# Patient Record
Sex: Female | Born: 1935 | Race: White | Hispanic: No | Smoking: Never smoker
Health system: Southern US, Community
[De-identification: ages and names within clinical notes are randomized; demographics above are authoritative.]

## PROBLEM LIST (undated history)

## (undated) DIAGNOSIS — M1990 Unspecified osteoarthritis, unspecified site: Secondary | ICD-10-CM

## (undated) DIAGNOSIS — F0390 Unspecified dementia without behavioral disturbance: Secondary | ICD-10-CM

## (undated) DIAGNOSIS — H5440 Blindness, one eye, unspecified eye: Secondary | ICD-10-CM

## (undated) HISTORY — PX: CHOLECYSTECTOMY: SHX55

---

## 2004-08-15 ENCOUNTER — Encounter: Payer: Self-pay | Admitting: Family Medicine

## 2004-09-01 ENCOUNTER — Encounter: Payer: Self-pay | Admitting: Family Medicine

## 2004-10-02 ENCOUNTER — Encounter: Payer: Self-pay | Admitting: Family Medicine

## 2011-08-13 ENCOUNTER — Ambulatory Visit: Payer: Self-pay | Admitting: Family Medicine

## 2011-08-19 ENCOUNTER — Emergency Department: Payer: Self-pay | Admitting: *Deleted

## 2011-08-19 LAB — COMPREHENSIVE METABOLIC PANEL
BUN: 20 mg/dL — ABNORMAL HIGH (ref 7–18)
Calcium, Total: 8.9 mg/dL (ref 8.5–10.1)
Chloride: 107 mmol/L (ref 98–107)
Co2: 31 mmol/L (ref 21–32)
Creatinine: 0.79 mg/dL (ref 0.60–1.30)
EGFR (African American): >60
EGFR (Non-African Amer.): >60
Glucose: 87 mg/dL (ref 65–99)
Osmolality: 291 (ref 275–301)
SGOT(AST): 27 U/L (ref 15–37)
Sodium: 145 mmol/L (ref 136–145)
Total Protein: 6.4 g/dL (ref 6.4–8.2)

## 2011-08-19 LAB — URINALYSIS, COMPLETE
Glucose,UR: NEGATIVE mg/dL (ref 0–75)
Hyaline Cast: 6
Ph: 6 (ref 4.5–8.0)
RBC,UR: 2 /HPF (ref 0–5)
Specific Gravity: 1.018 (ref 1.003–1.030)

## 2011-08-19 LAB — CBC
HGB: 13.3 g/dL (ref 12.0–16.0)
MCH: 32.2 pg (ref 26.0–34.0)
MCHC: 33.5 g/dL (ref 32.0–36.0)
MCV: 96 fL (ref 80–100)
Platelet: 146 10*3/uL — ABNORMAL LOW (ref 150–440)
RBC: 4.12 10*6/uL (ref 3.80–5.20)

## 2011-08-19 LAB — TROPONIN I: Troponin-I: <0.02 ng/mL

## 2011-08-19 LAB — LIPASE, BLOOD: Lipase: 213 U/L (ref 73–393)

## 2011-12-09 ENCOUNTER — Inpatient Hospital Stay: Payer: Self-pay | Admitting: Internal Medicine

## 2011-12-09 LAB — COMPREHENSIVE METABOLIC PANEL
Albumin: 2.7 g/dL — ABNORMAL LOW (ref 3.4–5.0)
Alkaline Phosphatase: 190 U/L — ABNORMAL HIGH (ref 50–136)
Anion Gap: 11 (ref 7–16)
BUN: 47 mg/dL — ABNORMAL HIGH (ref 7–18)
Calcium, Total: 8.6 mg/dL (ref 8.5–10.1)
Co2: 22 mmol/L (ref 21–32)
EGFR (Non-African Amer.): 16 — ABNORMAL LOW
Osmolality: 301 (ref 275–301)
SGOT(AST): 117 U/L — ABNORMAL HIGH (ref 15–37)
Sodium: 144 mmol/L (ref 136–145)
Total Protein: 5.5 g/dL — ABNORMAL LOW (ref 6.4–8.2)

## 2011-12-09 LAB — URINALYSIS, COMPLETE
Bilirubin,UR: NEGATIVE
Glucose,UR: NEGATIVE mg/dL (ref 0–75)
Ketone: NEGATIVE
Ph: 7 (ref 4.5–8.0)
RBC,UR: 38 /HPF (ref 0–5)
Squamous Epithelial: 1
WBC UR: 56 /HPF (ref 0–5)

## 2011-12-09 LAB — CBC
MCH: 31.9 pg (ref 26.0–34.0)
Platelet: 81 10*3/uL — ABNORMAL LOW (ref 150–440)
RBC: 3.74 10*6/uL — ABNORMAL LOW (ref 3.80–5.20)
RDW: 13.8 % (ref 11.5–14.5)
WBC: 8.1 10*3/uL (ref 3.6–11.0)

## 2011-12-09 LAB — TSH: Thyroid Stimulating Horm: 1.45 u[IU]/mL

## 2011-12-09 LAB — CK TOTAL AND CKMB (NOT AT ARMC)
CK, Total: 173 U/L (ref 21–215)
CK-MB: 1 ng/mL (ref 0.5–3.6)

## 2011-12-09 LAB — TROPONIN I: Troponin-I: 0.35 ng/mL — ABNORMAL HIGH

## 2011-12-10 DIAGNOSIS — I219 Acute myocardial infarction, unspecified: Secondary | ICD-10-CM

## 2011-12-10 LAB — CBC WITH DIFFERENTIAL/PLATELET
Basophil #: 0 10*3/uL (ref 0.0–0.1)
Eosinophil #: 0 10*3/uL (ref 0.0–0.7)
Eosinophil %: 0.6 %
Lymphocyte #: 0.9 10*3/uL — ABNORMAL LOW (ref 1.0–3.6)
Lymphocyte %: 14 %
MCV: 95 fL (ref 80–100)
Monocyte %: 3.9 %
Neutrophil %: 81.1 %
Platelet: 63 10*3/uL — ABNORMAL LOW (ref 150–440)
RDW: 14.3 % (ref 11.5–14.5)
WBC: 6.5 10*3/uL (ref 3.6–11.0)

## 2011-12-10 LAB — BASIC METABOLIC PANEL
Anion Gap: 10 (ref 7–16)
BUN: 49 mg/dL — ABNORMAL HIGH (ref 7–18)
Chloride: 116 mmol/L — ABNORMAL HIGH (ref 98–107)
Co2: 22 mmol/L (ref 21–32)
Creatinine: 2 mg/dL — ABNORMAL HIGH (ref 0.60–1.30)
EGFR (African American): 25 — ABNORMAL LOW
Potassium: 3.3 mmol/L — ABNORMAL LOW (ref 3.5–5.1)
Sodium: 148 mmol/L — ABNORMAL HIGH (ref 136–145)

## 2011-12-10 LAB — TROPONIN I: Troponin-I: 0.25 ng/mL — ABNORMAL HIGH

## 2011-12-11 LAB — HEPATIC FUNCTION PANEL A (ARMC)
Albumin: 2.1 g/dL — ABNORMAL LOW (ref 3.4–5.0)
Alkaline Phosphatase: 159 U/L — ABNORMAL HIGH (ref 50–136)
Bilirubin, Direct: 0.3 mg/dL — ABNORMAL HIGH (ref 0.00–0.20)
Bilirubin,Total: 0.7 mg/dL (ref 0.2–1.0)
Total Protein: 4.6 g/dL — ABNORMAL LOW (ref 6.4–8.2)

## 2011-12-11 LAB — CBC WITH DIFFERENTIAL/PLATELET
Basophil #: 0 10*3/uL (ref 0.0–0.1)
Eosinophil #: 0.2 10*3/uL (ref 0.0–0.7)
Lymphocyte %: 11.9 %
MCH: 32.1 pg (ref 26.0–34.0)
MCHC: 34.2 g/dL (ref 32.0–36.0)
Monocyte #: 0.4 x10 3/mm (ref 0.2–0.9)
Neutrophil %: 76.9 %
Platelet: 56 10*3/uL — ABNORMAL LOW (ref 150–440)
RBC: 3.33 10*6/uL — ABNORMAL LOW (ref 3.80–5.20)
RDW: 14.2 % (ref 11.5–14.5)
WBC: 5.1 10*3/uL (ref 3.6–11.0)

## 2011-12-11 LAB — BASIC METABOLIC PANEL
Calcium, Total: 7.9 mg/dL — ABNORMAL LOW (ref 8.5–10.1)
Co2: 19 mmol/L — ABNORMAL LOW (ref 21–32)
Creatinine: 1.52 mg/dL — ABNORMAL HIGH (ref 0.60–1.30)
Glucose: 110 mg/dL — ABNORMAL HIGH (ref 65–99)
Osmolality: 299 (ref 275–301)
Sodium: 144 mmol/L (ref 136–145)

## 2011-12-12 LAB — CBC WITH DIFFERENTIAL/PLATELET
Basophil #: 0 10*3/uL (ref 0.0–0.1)
Basophil %: 0.4 %
Eosinophil #: 0.3 10*3/uL (ref 0.0–0.7)
HCT: 32.6 % — ABNORMAL LOW (ref 35.0–47.0)
Monocyte #: 0.7 x10 3/mm (ref 0.2–0.9)
Neutrophil #: 4.2 10*3/uL (ref 1.4–6.5)
Neutrophil %: 65.8 %
Platelet: 52 10*3/uL — ABNORMAL LOW (ref 150–440)
RBC: 3.48 10*6/uL — ABNORMAL LOW (ref 3.80–5.20)
RDW: 14.9 % — ABNORMAL HIGH (ref 11.5–14.5)
WBC: 6.4 10*3/uL (ref 3.6–11.0)

## 2011-12-12 LAB — COMPREHENSIVE METABOLIC PANEL
Albumin: 2 g/dL — ABNORMAL LOW (ref 3.4–5.0)
Alkaline Phosphatase: 181 U/L — ABNORMAL HIGH (ref 50–136)
Anion Gap: 6 — ABNORMAL LOW (ref 7–16)
Bilirubin,Total: 0.5 mg/dL (ref 0.2–1.0)
Calcium, Total: 8.1 mg/dL — ABNORMAL LOW (ref 8.5–10.1)
Chloride: 115 mmol/L — ABNORMAL HIGH (ref 98–107)
Co2: 22 mmol/L (ref 21–32)
EGFR (African American): 45 — ABNORMAL LOW
EGFR (Non-African Amer.): 39 — ABNORMAL LOW
Glucose: 121 mg/dL — ABNORMAL HIGH (ref 65–99)
Osmolality: 293 (ref 275–301)
Sodium: 143 mmol/L (ref 136–145)

## 2011-12-13 LAB — URINE CULTURE

## 2011-12-19 ENCOUNTER — Ambulatory Visit: Payer: Self-pay | Admitting: Urology

## 2012-01-01 ENCOUNTER — Ambulatory Visit: Payer: Self-pay | Admitting: Urology

## 2012-07-20 ENCOUNTER — Ambulatory Visit: Payer: Self-pay | Admitting: Family Medicine

## 2012-11-27 ENCOUNTER — Ambulatory Visit: Payer: Self-pay

## 2012-12-08 ENCOUNTER — Ambulatory Visit: Payer: Self-pay | Admitting: Urology

## 2013-01-21 ENCOUNTER — Inpatient Hospital Stay: Payer: Self-pay | Admitting: Internal Medicine

## 2013-01-21 LAB — CBC WITH DIFFERENTIAL/PLATELET
Basophil #: 0 10*3/uL (ref 0.0–0.1)
Basophil %: 0.3 %
Eosinophil #: 0 10*3/uL (ref 0.0–0.7)
HGB: 12 g/dL (ref 12.0–16.0)
Lymphocyte %: 5.1 %
MCH: 31.2 pg (ref 26.0–34.0)
MCHC: 34.2 g/dL (ref 32.0–36.0)
MCV: 91 fL (ref 80–100)
Neutrophil #: 6.2 10*3/uL (ref 1.4–6.5)
Neutrophil %: 93 %
Platelet: 137 10*3/uL — ABNORMAL LOW (ref 150–440)
WBC: 6.7 10*3/uL (ref 3.6–11.0)

## 2013-01-21 LAB — COMPREHENSIVE METABOLIC PANEL
BUN: 16 mg/dL (ref 7–18)
Calcium, Total: 8.3 mg/dL — ABNORMAL LOW (ref 8.5–10.1)
Co2: 23 mmol/L (ref 21–32)
Creatinine: 1.52 mg/dL — ABNORMAL HIGH (ref 0.60–1.30)
EGFR (African American): 35 — ABNORMAL LOW
EGFR (Non-African Amer.): 30 — ABNORMAL LOW
Osmolality: 280 (ref 275–301)
Potassium: 3.7 mmol/L (ref 3.5–5.1)
SGPT (ALT): 12 U/L (ref 12–78)
Total Protein: 6.2 g/dL — ABNORMAL LOW (ref 6.4–8.2)

## 2013-01-21 LAB — URINALYSIS, COMPLETE
Glucose,UR: NEGATIVE mg/dL (ref 0–75)
Ketone: NEGATIVE
Protein: 100
RBC,UR: 25 /HPF (ref 0–5)
Specific Gravity: 1.011 (ref 1.003–1.030)
Squamous Epithelial: NONE SEEN
WBC UR: 92 /HPF (ref 0–5)

## 2013-01-22 LAB — BASIC METABOLIC PANEL WITH GFR
Anion Gap: 3 — ABNORMAL LOW
BUN: 20 mg/dL — ABNORMAL HIGH
Calcium, Total: 7.9 mg/dL — ABNORMAL LOW
Chloride: 113 mmol/L — ABNORMAL HIGH
Co2: 24 mmol/L
Creatinine: 1.53 mg/dL — ABNORMAL HIGH
EGFR (African American): 35 — ABNORMAL LOW
EGFR (Non-African Amer.): 30 — ABNORMAL LOW
Glucose: 71 mg/dL
Osmolality: 280
Potassium: 3.9 mmol/L
Sodium: 140 mmol/L

## 2013-01-24 LAB — URINE CULTURE

## 2013-01-26 LAB — CULTURE, BLOOD (SINGLE)

## 2013-01-27 LAB — CULTURE, BLOOD (SINGLE)

## 2013-01-29 ENCOUNTER — Inpatient Hospital Stay: Payer: Self-pay | Admitting: Internal Medicine

## 2013-01-29 LAB — URINALYSIS, COMPLETE
Glucose,UR: NEGATIVE mg/dL (ref 0–75)
Ketone: NEGATIVE
Ph: 5 (ref 4.5–8.0)
Protein: NEGATIVE
Specific Gravity: 1.005 (ref 1.003–1.030)

## 2013-01-29 LAB — COMPREHENSIVE METABOLIC PANEL
Albumin: 2 g/dL — ABNORMAL LOW (ref 3.4–5.0)
Alkaline Phosphatase: 233 U/L — ABNORMAL HIGH
BUN: 25 mg/dL — ABNORMAL HIGH (ref 7–18)
Bilirubin,Total: 0.6 mg/dL (ref 0.2–1.0)
Co2: 23 mmol/L (ref 21–32)
EGFR (African American): 24 — ABNORMAL LOW
Osmolality: 267 (ref 275–301)
Potassium: 4.2 mmol/L (ref 3.5–5.1)
SGPT (ALT): 20 U/L (ref 12–78)
Sodium: 132 mmol/L — ABNORMAL LOW (ref 136–145)

## 2013-01-29 LAB — CBC
HCT: 30.1 % — ABNORMAL LOW (ref 35.0–47.0)
HGB: 10.2 g/dL — ABNORMAL LOW (ref 12.0–16.0)
Platelet: 164 10*3/uL (ref 150–440)
WBC: 15.1 10*3/uL — ABNORMAL HIGH (ref 3.6–11.0)

## 2013-01-30 LAB — CBC WITH DIFFERENTIAL/PLATELET
Basophil #: 0.1 10*3/uL (ref 0.0–0.1)
Eosinophil %: 2.9 %
HGB: 9.8 g/dL — ABNORMAL LOW (ref 12.0–16.0)
Lymphocyte %: 8.6 %
MCHC: 33.9 g/dL (ref 32.0–36.0)
Monocyte %: 3.8 %
Platelet: 172 10*3/uL (ref 150–440)
RBC: 3.18 10*6/uL — ABNORMAL LOW (ref 3.80–5.20)
WBC: 12.1 10*3/uL — ABNORMAL HIGH (ref 3.6–11.0)

## 2013-01-30 LAB — BASIC METABOLIC PANEL
Anion Gap: 7 (ref 7–16)
Calcium, Total: 7.7 mg/dL — ABNORMAL LOW (ref 8.5–10.1)
Chloride: 106 mmol/L (ref 98–107)
Co2: 22 mmol/L (ref 21–32)
Creatinine: 1.74 mg/dL — ABNORMAL HIGH (ref 0.60–1.30)
EGFR (Non-African Amer.): 26 — ABNORMAL LOW
Osmolality: 273 (ref 275–301)
Potassium: 3.9 mmol/L (ref 3.5–5.1)
Sodium: 135 mmol/L — ABNORMAL LOW (ref 136–145)

## 2013-01-31 LAB — CREATININE, SERUM
Creatinine: 1.33 mg/dL — ABNORMAL HIGH (ref 0.60–1.30)
EGFR (African American): 41 — ABNORMAL LOW

## 2013-02-01 LAB — URINE CULTURE

## 2013-02-03 LAB — CULTURE, BLOOD (SINGLE)

## 2013-05-16 ENCOUNTER — Ambulatory Visit: Payer: Self-pay | Admitting: Orthopedic Surgery

## 2013-05-16 ENCOUNTER — Inpatient Hospital Stay: Payer: Self-pay | Admitting: Family Medicine

## 2013-05-16 LAB — URINALYSIS, COMPLETE
BILIRUBIN, UR: NEGATIVE
Glucose,UR: NEGATIVE mg/dL (ref 0–75)
KETONE: NEGATIVE
Nitrite: NEGATIVE
Ph: 7 (ref 4.5–8.0)
Specific Gravity: 1.009 (ref 1.003–1.030)
Squamous Epithelial: 1
WBC UR: 109 /HPF (ref 0–5)

## 2013-05-16 LAB — CBC
HCT: 34.3 % — AB (ref 35.0–47.0)
HGB: 11.8 g/dL — ABNORMAL LOW (ref 12.0–16.0)
MCH: 31 pg (ref 26.0–34.0)
MCHC: 34.4 g/dL (ref 32.0–36.0)
MCV: 90 fL (ref 80–100)
Platelet: 152 10*3/uL (ref 150–440)
RBC: 3.8 10*6/uL (ref 3.80–5.20)
RDW: 13.7 % (ref 11.5–14.5)
WBC: 7.4 10*3/uL (ref 3.6–11.0)

## 2013-05-16 LAB — BASIC METABOLIC PANEL
Anion Gap: 9 (ref 7–16)
BUN: 14 mg/dL (ref 7–18)
CALCIUM: 8.6 mg/dL (ref 8.5–10.1)
Chloride: 109 mmol/L — ABNORMAL HIGH (ref 98–107)
Co2: 24 mmol/L (ref 21–32)
Creatinine: 1.14 mg/dL (ref 0.60–1.30)
EGFR (African American): 50 — ABNORMAL LOW
GFR CALC NON AF AMER: 43 — AB
Glucose: 129 mg/dL — ABNORMAL HIGH (ref 65–99)
Osmolality: 285 (ref 275–301)
Potassium: 3 mmol/L — ABNORMAL LOW (ref 3.5–5.1)
Sodium: 142 mmol/L (ref 136–145)

## 2013-05-16 LAB — PROTIME-INR
INR: 1
Prothrombin Time: 13.1 secs (ref 11.5–14.7)

## 2013-05-16 LAB — APTT: Activated PTT: 35.6 secs (ref 23.6–35.9)

## 2013-05-17 LAB — BASIC METABOLIC PANEL
ANION GAP: 10 (ref 7–16)
BUN: 14 mg/dL (ref 7–18)
CO2: 21 mmol/L (ref 21–32)
Calcium, Total: 7.7 mg/dL — ABNORMAL LOW (ref 8.5–10.1)
Chloride: 111 mmol/L — ABNORMAL HIGH (ref 98–107)
Creatinine: 1.12 mg/dL (ref 0.60–1.30)
EGFR (African American): 51 — ABNORMAL LOW
GFR CALC NON AF AMER: 44 — AB
GLUCOSE: 207 mg/dL — AB (ref 65–99)
Osmolality: 290 (ref 275–301)
POTASSIUM: 3.4 mmol/L — AB (ref 3.5–5.1)
Sodium: 142 mmol/L (ref 136–145)

## 2013-05-17 LAB — MAGNESIUM: Magnesium: 2 mg/dL

## 2013-05-17 LAB — TSH: THYROID STIMULATING HORM: 9.44 u[IU]/mL — AB

## 2013-05-17 LAB — CK: CK, Total: 553 U/L — ABNORMAL HIGH

## 2013-05-18 LAB — BASIC METABOLIC PANEL
ANION GAP: 2 — AB (ref 7–16)
BUN: 23 mg/dL — ABNORMAL HIGH (ref 7–18)
CREATININE: 1.41 mg/dL — AB (ref 0.60–1.30)
Calcium, Total: 8 mg/dL — ABNORMAL LOW (ref 8.5–10.1)
Chloride: 114 mmol/L — ABNORMAL HIGH (ref 98–107)
Co2: 26 mmol/L (ref 21–32)
EGFR (Non-African Amer.): 33 — ABNORMAL LOW
GFR CALC AF AMER: 38 — AB
Glucose: 125 mg/dL — ABNORMAL HIGH (ref 65–99)
Osmolality: 288 (ref 275–301)
POTASSIUM: 5.5 mmol/L — AB (ref 3.5–5.1)
Sodium: 142 mmol/L (ref 136–145)

## 2013-05-18 LAB — CBC WITH DIFFERENTIAL/PLATELET
Basophil #: 0 10*3/uL (ref 0.0–0.1)
Basophil %: 0.5 %
EOS PCT: 0.2 %
Eosinophil #: 0 10*3/uL (ref 0.0–0.7)
HCT: 21.6 % — ABNORMAL LOW (ref 35.0–47.0)
HGB: 7.2 g/dL — ABNORMAL LOW (ref 12.0–16.0)
LYMPHS ABS: 1.7 10*3/uL (ref 1.0–3.6)
Lymphocyte %: 19.5 %
MCH: 30.6 pg (ref 26.0–34.0)
MCHC: 33.3 g/dL (ref 32.0–36.0)
MCV: 92 fL (ref 80–100)
MONO ABS: 0.7 x10 3/mm (ref 0.2–0.9)
Monocyte %: 8.4 %
Neutrophil #: 6.1 10*3/uL (ref 1.4–6.5)
Neutrophil %: 71.4 %
Platelet: 112 10*3/uL — ABNORMAL LOW (ref 150–440)
RBC: 2.35 10*6/uL — ABNORMAL LOW (ref 3.80–5.20)
RDW: 13.6 % (ref 11.5–14.5)
WBC: 8.5 10*3/uL (ref 3.6–11.0)

## 2013-05-18 LAB — HEMOGLOBIN: HGB: 8.5 g/dL — ABNORMAL LOW (ref 12.0–16.0)

## 2013-05-18 LAB — TSH: THYROID STIMULATING HORM: 1.84 u[IU]/mL

## 2013-05-18 LAB — POTASSIUM
POTASSIUM: 5.4 mmol/L — AB (ref 3.5–5.1)
POTASSIUM: 4.8 mmol/L (ref 3.5–5.1)

## 2013-05-18 LAB — MAGNESIUM: MAGNESIUM: 2 mg/dL

## 2013-05-19 LAB — BASIC METABOLIC PANEL
Anion Gap: 1 — ABNORMAL LOW (ref 7–16)
BUN: 21 mg/dL — ABNORMAL HIGH (ref 7–18)
CO2: 28 mmol/L (ref 21–32)
Calcium, Total: 8.1 mg/dL — ABNORMAL LOW (ref 8.5–10.1)
Chloride: 112 mmol/L — ABNORMAL HIGH (ref 98–107)
Creatinine: 1.06 mg/dL (ref 0.60–1.30)
EGFR (African American): 54 — ABNORMAL LOW
GFR CALC NON AF AMER: 47 — AB
GLUCOSE: 85 mg/dL (ref 65–99)
OSMOLALITY: 283 (ref 275–301)
Potassium: 4 mmol/L (ref 3.5–5.1)
Sodium: 141 mmol/L (ref 136–145)

## 2013-05-19 LAB — PLATELET COUNT: Platelet: 78 10*3/uL — ABNORMAL LOW (ref 150–440)

## 2013-05-19 LAB — CK: CK, Total: 312 U/L — ABNORMAL HIGH

## 2013-05-19 LAB — HEMOGLOBIN: HGB: 7.1 g/dL — ABNORMAL LOW (ref 12.0–16.0)

## 2013-05-20 LAB — CBC WITH DIFFERENTIAL/PLATELET
BASOS ABS: 0.1 10*3/uL (ref 0.0–0.1)
Basophil %: 0.9 %
EOS ABS: 0.2 10*3/uL (ref 0.0–0.7)
Eosinophil %: 3.3 %
HCT: 26.7 % — ABNORMAL LOW (ref 35.0–47.0)
HGB: 9.1 g/dL — ABNORMAL LOW (ref 12.0–16.0)
LYMPHS ABS: 1.1 10*3/uL (ref 1.0–3.6)
Lymphocyte %: 17.1 %
MCH: 30.5 pg (ref 26.0–34.0)
MCHC: 34.2 g/dL (ref 32.0–36.0)
MCV: 89 fL (ref 80–100)
MONOS PCT: 6.2 %
Monocyte #: 0.4 x10 3/mm (ref 0.2–0.9)
NEUTROS PCT: 72.5 %
Neutrophil #: 4.6 10*3/uL (ref 1.4–6.5)
Platelet: 88 10*3/uL — ABNORMAL LOW (ref 150–440)
RBC: 3 10*6/uL — AB (ref 3.80–5.20)
RDW: 14.8 % — AB (ref 11.5–14.5)
WBC: 6.3 10*3/uL (ref 3.6–11.0)

## 2013-05-20 LAB — PHENYTOIN LEVEL, TOTAL: Dilantin: 11.1 ug/mL (ref 10.0–20.0)

## 2013-05-20 LAB — ALBUMIN: Albumin: 2.3 g/dL — ABNORMAL LOW (ref 3.4–5.0)

## 2013-05-21 LAB — URINALYSIS, COMPLETE
Bilirubin,UR: NEGATIVE
Glucose,UR: NEGATIVE mg/dL (ref 0–75)
Ketone: NEGATIVE
NITRITE: NEGATIVE
Ph: 6 (ref 4.5–8.0)
Protein: NEGATIVE
RBC,UR: 21 /HPF (ref 0–5)
SPECIFIC GRAVITY: 1.011 (ref 1.003–1.030)

## 2013-05-21 LAB — CBC WITH DIFFERENTIAL/PLATELET
Basophil #: 0 10*3/uL (ref 0.0–0.1)
Basophil %: 0.6 %
EOS PCT: 3.6 %
Eosinophil #: 0.2 10*3/uL (ref 0.0–0.7)
HCT: 24.9 % — AB (ref 35.0–47.0)
HGB: 8.4 g/dL — ABNORMAL LOW (ref 12.0–16.0)
LYMPHS ABS: 1.1 10*3/uL (ref 1.0–3.6)
Lymphocyte %: 26.6 %
MCH: 30.1 pg (ref 26.0–34.0)
MCHC: 34 g/dL (ref 32.0–36.0)
MCV: 89 fL (ref 80–100)
MONO ABS: 0.4 x10 3/mm (ref 0.2–0.9)
Monocyte %: 10.6 %
NEUTROS ABS: 2.5 10*3/uL (ref 1.4–6.5)
Neutrophil %: 58.6 %
Platelet: 92 10*3/uL — ABNORMAL LOW (ref 150–440)
RBC: 2.8 10*6/uL — AB (ref 3.80–5.20)
RDW: 14.4 % (ref 11.5–14.5)
WBC: 4.2 10*3/uL (ref 3.6–11.0)

## 2013-05-22 LAB — CBC WITH DIFFERENTIAL/PLATELET
Basophil #: 0 10*3/uL (ref 0.0–0.1)
Basophil %: 0.7 %
EOS ABS: 0.3 10*3/uL (ref 0.0–0.7)
EOS PCT: 6 %
HCT: 25.7 % — ABNORMAL LOW (ref 35.0–47.0)
HGB: 8.7 g/dL — ABNORMAL LOW (ref 12.0–16.0)
LYMPHS ABS: 1.1 10*3/uL (ref 1.0–3.6)
LYMPHS PCT: 22.7 %
MCH: 30.2 pg (ref 26.0–34.0)
MCHC: 34 g/dL (ref 32.0–36.0)
MCV: 89 fL (ref 80–100)
MONO ABS: 0.4 x10 3/mm (ref 0.2–0.9)
Monocyte %: 7.8 %
Neutrophil #: 2.9 10*3/uL (ref 1.4–6.5)
Neutrophil %: 62.8 %
Platelet: 108 10*3/uL — ABNORMAL LOW (ref 150–440)
RBC: 2.9 10*6/uL — AB (ref 3.80–5.20)
RDW: 14.6 % — ABNORMAL HIGH (ref 11.5–14.5)
WBC: 4.7 10*3/uL (ref 3.6–11.0)

## 2013-05-22 LAB — BASIC METABOLIC PANEL
Anion Gap: 5 — ABNORMAL LOW (ref 7–16)
BUN: 17 mg/dL (ref 7–18)
CALCIUM: 7.8 mg/dL — AB (ref 8.5–10.1)
CREATININE: 0.88 mg/dL (ref 0.60–1.30)
Chloride: 104 mmol/L (ref 98–107)
Co2: 29 mmol/L (ref 21–32)
EGFR (Non-African Amer.): 59 — ABNORMAL LOW
Glucose: 71 mg/dL (ref 65–99)
Osmolality: 276 (ref 275–301)
Potassium: 3.2 mmol/L — ABNORMAL LOW (ref 3.5–5.1)
Sodium: 138 mmol/L (ref 136–145)

## 2013-05-22 LAB — URINE CULTURE

## 2013-05-23 ENCOUNTER — Ambulatory Visit (HOSPITAL_COMMUNITY)
Admission: AD | Admit: 2013-05-23 | Discharge: 2013-05-23 | Disposition: A | Discharge status: Home / Self Care | Payer: Medicare Other | Source: Other Acute Inpatient Hospital | Attending: Internal Medicine | Admitting: Internal Medicine

## 2013-05-23 DIAGNOSIS — X58XXXA Exposure to other specified factors, initial encounter: Secondary | ICD-10-CM | POA: Insufficient documentation

## 2013-05-23 DIAGNOSIS — S72009A Fracture of unspecified part of neck of unspecified femur, initial encounter for closed fracture: Secondary | ICD-10-CM | POA: Insufficient documentation

## 2013-05-26 LAB — CULTURE, BLOOD (SINGLE)

## 2013-05-26 LAB — URINE CULTURE

## 2014-06-21 NOTE — H&P (Signed)
PATIENT NAME:  [name], [name] MR#:  [id number] DATE OF BIRTH:  07-17-1922  DATE OF ADMISSION:  12/09/2011  PRIMARY CARE PHYSICIAN: Dr. Hillery Aldo  ER PHYSICIAN: Dr. Charmian Muff   CHIEF COMPLAINT: Weakness, lethargy.   HISTORY OF PRESENT ILLNESS: Patient is an 81 year old female who was living with the daughter, has no other medical problems, found to be lethargic and patient was sleeping till 2:00 p.m. in the evening. Patient's daughter checked her blood pressure, it was low and patient's daughter mentioned that blood pressure was 87/40 something like that and she noted that her mother was not eating properly for the past few days with decreased p.o. intake and more lethargic today and also hypotensive so she came in. Patient was evaluated by ER physician, found to have acute renal failure with sinus bradycardia. Patient denies any chest pain. No trouble breathing, no abdominal pain, no nausea, no vomiting. Patient feels not like eating with loss of appetite. Feels weak overall but denies any specific complaints.   PAST MEDICAL HISTORY: No hypertension or diabetes.   ALLERGIES: She is allergic to penicillin. According daughter she gets rash.   SOCIAL HISTORY: Previous use of tobacco. No alcohol. No drugs. Patient right now is living with the daughter.   PAST SURGICAL HISTORY: No surgeries before except cystoscopy.   FAMILY HISTORY: Has no hypertension or diabetes.   PAST MEDICAL HISTORY: Patient has history of recurrent urinary tract infections, has been seeing Dr. Achilles Dunk also and had cystoscopy and been on antibiotics since June this year. She was on Levaquin and then finished Bactrim for 10 days a week ago.   REVIEW OF SYSTEMS: CONSTITUTIONAL: Denies any fever. Complains of fatigue. EYES: No blurred vision. ENT: No tinnitus. No ear pain. RESPIRATORY: No cough. CARDIOVASCULAR: No chest pain. No orthopnea. GASTROINTESTINAL: No nausea. No vomiting but poor appetite. Denies any abdominal  pain. GENITOURINARY: No dysuria. ENDOCRINE: Patient has no diabetes, no hypothyroidism. MUSCULOSKELETAL: No joint pain. NEUROLOGIC: No numbness or weakness. PSYCH: No anxiety or insomnia.   PHYSICAL EXAMINATION:  VITAL SIGNS: Initially temperature 98.1, pulse 58, respirations 18, blood pressure 90/47. Patient has already finished 1 liter of fluid, next fluid is going on with normal saline. Blood pressure during my visit 100/50, heart rate still in 50s.   GENERAL: Patient is alert, awake, oriented. No chest pain.  HEAD: Atraumatic, normocephalic. Pupils equally reacting to light. Extraocular movements are intact.   ENT: No tympanic membrane congestion. No turbinate hypertrophy. No oropharyngeal erythema.   NECK: Normal range of motion. No JVD. No carotid bruit.   CARDIOVASCULAR: S1, S2 regular. No murmurs.   LUNGS: Clear to auscultation. No wheeze. No rales.   ABDOMEN: Soft, nontender, nondistended. Bowel sounds are   present. No hernia.    MUSCULOSKELETAL: Strength 5/5 upper and lower extremities. Sensation intact. Deep tendon reflexes 2+ bilaterally.   SKIN: No skin rashes.   NEUROLOGIC: Cranial nerves II through XII intact. Power 5/5 in upper and lower extremities. No dysarthria or aphasia.   PSYCH: Oriented to time, place, person.   LABORATORY, DIAGNOSTIC AND RADIOLOGICAL DATA: WBC 8.1, hemoglobin 11.9, hematocrit 35.2, platelets 81. Electrolytes: Sodium 144, potassium 3.5, chloride 111, bicarbonate 22, BUN 47, creatinine 2.61, glucose 124. Liver functions: Alkaline phosphatase is elevated at 190, total bilaterally 1.3, AST 117, ALT 65.   Albumin 2.7. Troponin 0.35, Urinalysis shows urine 1+ leukocyte esterase with bacteria trace, budding yeast present. CT of the abdomen was done on 08/19/2011 which showed bladder wall thickening and patient has  cystitis. Patient noted to have prominent common bile duct, it is around 11 mm in diameter. M.R.C.P. should be considered according to the  result. Spleen was normal, appendix normal, lung bases are clear. Bilateral nephrolithiasis and patient also had tiny phlebolith in the pelvis. Patient had nonobstructing ureteral stones cannot be excluded. No hydronephrosis. Patient had cystoscopy with Dr. Achilles Dunkope after that. Patient's kidney function was normal on 06/17; BUN 20, creatinine 0.79. Liver functions also showed slightly elevated alkaline phosphatase at 148, but ALT and AST were normal at that time but now they are both elevated ALT was normal limits but AST is elevated at 117, alkaline phosphatase 190. Also bilirubin is elevated at 1.3. Patient's EKG showed sinus bradycardia with some PACs.   ASSESSMENT AND PLAN:  71. 81 year old female with poor p.o. intake with increased somnolence likely secondary to acute renal failure with dehydration. Patient is admitted to hospitalist service. Continue the fluids. Patient likely has acute renal failure probably due to poor fluid intake and also recent Bactrim use so monitor BMP in the morning. Monitor the urine output and if BMP gets worse with creatinine getting worsen consider renal sonogram.  2. Elevated LFTs with worsening jaundice and elevated alkaline phosphatase, elevated CBD by the CAT scan recently so will request GI consult for possible M.R.C.P. Patient is symptomatic.  3. Urinary tract infection. Patient was treated with multiple antibiotics recently. Obtain medical records from Dr. Hillery AldoSarah Fisher regarding cultures. For now will continue vancomycin for possible MRSA and follow the urine culture and adjust antibiotics accordingly. 4. Weakness. Patient will be seen by physical therapist because of generalized weakness and a dietary consult also for her malnutrition.    TIME SPENT: About 60 minutes.        Discussed the course with the daughter. Daughter says she does not want cardiopulmonary resuscitation or intubation and DO NOT RESUSCITATE/DO NOT INTUBATE status is listed in patient's orders.     ____________________________ Katha HammingSnehalatha Manfred Laspina, MD sk:cms D: 12/09/2011 20:34:07 ET T: 12/10/2011 06:30:05 ET JOB#: [id number]  cc: Katha HammingSnehalatha Nika Yazzie, MD, <Dictator> Wendy "Sallie" Allena KatzPatel, MD Katha HammingSNEHALATHA Olman Yono MD ELECTRONICALLY SIGNED 01/14/2012 14:07

## 2014-06-21 NOTE — H&P (Signed)
PATIENT NAME:  [name], [name] DATE OF BIRTH:  04-16-1922  DATE OF ADMISSION:  12/09/2011  ADDENDUM  Patient's troponin is up at 0.35. Patient denies any chest pain. EKG also does not show any significant changes. Troponin elevation is probably secondary to renal failure. Cycle the troponin two more times and get echocardiogram.   ____________________________ Katha HammingSnehalatha Oria Klimas, MD sk:cms D: 12/09/2011 20:36:57 ET T: 12/10/2011 06:47:06 ET JOB#: [id number]  cc: Katha HammingSnehalatha Annaleigh Steinmeyer, MD, <Dictator> Katha HammingSNEHALATHA Kimmora Risenhoover MD ELECTRONICALLY SIGNED 01/04/2012 22:44

## 2014-06-21 NOTE — Consult Note (Signed)
Chief Complaint:   Subjective/Chief Complaint Feels better. Tolerating diet. S/P cystoscopy and stent placement.   VITAL SIGNS/ANCILLARY NOTES: **Vital Signs.:   09-Oct-13 15:54   Vital Signs Type Routine   Temperature Temperature (F) 98   Celsius 36.6   Temperature Source Oral   Pulse Pulse 74   Respirations Respirations 18   Systolic BP Systolic BP 945   Diastolic BP (mmHg) Diastolic BP (mmHg) 66   Mean BP 87   Pulse Ox % Pulse Ox % 95   Pulse Ox Activity Level  At rest   Oxygen Delivery Room Air/ 21 %   Lab Results: Hepatic:  09-Oct-13 04:26    Bilirubin, Total 0.7   Bilirubin, Direct  0.3 (Result(s) reported on 11 Dec 2011 at 05:07AM.)   Alkaline Phosphatase  159   SGPT (ALT) 34   SGOT (AST)  41   Total Protein, Serum  4.6   Albumin, Serum  2.1  Routine Chem:  09-Oct-13 04:26    Glucose, Serum  110   BUN  46   Creatinine (comp)  1.52   Sodium, Serum 144   Potassium, Serum 3.8   Chloride, Serum  115   CO2, Serum  19   Calcium (Total), Serum  7.9   Anion Gap 10   Osmolality (calc) 299   eGFR (African American)  35   eGFR (Non-African American)  31 (eGFR values <96m/min/1.73 m2 may be an indication of chronic kidney disease (CKD). Calculated eGFR is useful in patients with stable renal function. The eGFR calculation will not be reliable in acutely ill patients when serum creatinine is changing rapidly. It is not useful in  patients on dialysis. The eGFR calculation may not be applicable to patients at the low and high extremes of body sizes, pregnant women, and vegetarians.)  Routine Hem:  09-Oct-13 04:26    WBC (CBC) 5.1   RBC (CBC)  3.33   Hemoglobin (CBC)  10.7   Hematocrit (CBC)  31.2   Platelet Count (CBC)  56   MCV 94   MCH 32.1   MCHC 34.2   RDW 14.2   Neutrophil % 76.9   Lymphocyte % 11.9   Monocyte % 7.4   Eosinophil % 3.4   Basophil % 0.4   Neutrophil # 4.0   Lymphocyte #  0.6   Monocyte # 0.4   Eosinophil # 0.2   Basophil # 0.0  (Result(s) reported on 11 Dec 2011 at 05:39AM.)   Assessment/Plan:  Assessment/Plan:   Assessment Abnormal liver enzymes most likely secondary to UTI and recent use ofantibiotics. Trending down and are almost normal. CBD dilation stable and is most likely secondary to prior cholecystectomy.    Plan No further GI recommendations. Will sign off. Please call [place] needed. Thanks.   Electronic Signatures: IJill Side(MD)  (Signed 09-Oct-13 17:31)  Authored: Chief Complaint, VITAL SIGNS/ANCILLARY NOTES, Lab Results, Assessment/Plan   Last Updated: 09-Oct-13 17:31 by IJill Side(MD)

## 2014-06-21 NOTE — Consult Note (Signed)
Brief Consult Note: Diagnosis: Renal failure, hypotension, lethargy, abnormal LFT's  and abnormal imaging.   Patient was seen by consultant.   Comments: Patient overall in poor health admitted with multiple issues including renal failure, decreased PO intake, high troponin, renal stones and hydronephrosis, mildly abnormal LFT's and dilated CBD on imaging. Feels better today and tolerating diet well.  The abnormal LFT's may be secondary to recent UTI and use of antibiotics. Dilated CBD most likely secondary to advanced age and prior cholecystectomy.  Will obtain additional blood tests and consider an MRCP depending on hospital course and improvement in renal function. Discussed with her and her family.  Electronic Signatures: Lurline DelIftikhar, Yusif Gnau (MD)  (Signed 08-Oct-13 17:04)  Authored: Brief Consult Note   Last Updated: 08-Oct-13 17:04 by Lurline DelIftikhar, Early Steel (MD)

## 2014-06-21 NOTE — Op Note (Signed)
PATIENT NAME:  [name], [name] MR#:  [id number] DATE OF BIRTH:  04/05/1922  DATE OF PROCEDURE:  12/11/2011  PREOPERATIVE DIAGNOSES:  1. Left UPJ stone with obstruction.  2. Possible urinary tract infection.  3. History of abnormal bladder mucosa.   POSTOPERATIVE DIAGNOSES:  1. Left UPJ stone with obstruction.  2. Possible urinary tract infection.  3. History of abnormal bladder mucosa.    PROCEDURES:  1. Cystoscopy with placement of left ureteral stent.  2. Bladder biopsies.   SURGEON: Tanuj Mullens C. Lonna CobbStoioff, MD    ASSISTANT: None.   ANESTHETIC: General.   INDICATIONS: This is an 81 year old female admitted with lethargy, confusion, and hypotension. She complains of left flank pain. An admission CT was remarkable for a 7 mm left UPJ stone with obstruction. She also has history of chronic cystitis and cystoscopy by Dr. Achilles Dunkope in July did show abnormal bladder mucosa. She is scheduled for office cystoscopy in late October with plan to schedule a bladder biopsy if abnormalities were persistent. Her admission urine culture did show pyuria and microhematuria with preliminary cultures negative.   DESCRIPTION OF PROCEDURE: The patient was taken to the operating room where a general anesthetic was administered. She was placed in the low lithotomy position and her external genitalia were prepped and draped in the usual fashion. Time-out was performed. A 21 French cystoscope sheath and obturator was lubricated and passed per urethra. Panendoscopy was then performed which did show hyperemic bladder mucosa primarily at the bladder base/infero-posterior wall. Ureteral orifices were normal in appearance. There was clear efflux from the right orifice. No efflux was seen from the left ureteral orifice. The stone could be visualized on fluoroscopy. A 0.035 guidewire was placed through the cystoscope and into the left ureteral orifice. A wire was passed up under fluoroscopic guidance. It was easily negotiated past  the stone. A 5 French open-ended ureteral catheter was then placed over the wire. Above the stone the wire was removed. 10 mL of dark bloody foul-smelling urine was aspirated and sent for culture. Retrograde pyelogram was performed which shows no extravasation and mild to moderate hydronephrosis. Guidewire was replaced and the 5 [place]French open-ended ureteral catheter was removed. A 6 French/24 cm ureteral stent was placed without difficulty. There was good curl seen in the renal pelvis under fluoroscopy. The distal end of the stent was well positioned in the bladder. Cold cup bladder biopsies x3 were then taken of the areas of erythema at the bladder base. Site was fulgurated with a Bugbee electrode. No significant bleeding was noted. It was elected not to place a Foley catheter. A B and O suppository was placed per rectum. She was taken to the PAC-U in stable condition. Her stone will be treated with shockwave lithotripsy at a later date.    ____________________________ Verna CzechScott C. Lonna CobbStoioff, MD scs:drc D: 12/11/2011 14:54:04 ET T: 12/11/2011 15:17:45 ET JOB#: [id number]  cc: Lorin PicketScott C. Lonna CobbStoioff, MD, <Dictator> Riki AltesSCOTT C Angelle Isais MD ELECTRONICALLY SIGNED 12/11/2011 15:32

## 2014-06-21 NOTE — Discharge Summary (Signed)
Fisher NAME:  [name], [name] MR#:  [id number] DATE OF BIRTH:  [birth year]  DATE OF ADMISSION:  12/09/2011 DATE OF DISCHARGE:  12/12/2011  DIAGNOSES:  1. Acute renal failure likely due to obstructive uropathy due to kidney stones compounded by dehydration and poor oral intake.  2. Possible urinary tract infection. 3. Thrombocytopenia, stable. 4. Anemia.  5. Hyponatremia.  6. Hypokalemia.  7. Elevated liver function tests, normalized.  8. Elevated troponin likely due to demand ischemia. 9. Dementia.   DISPOSITION: [name] is being discharged home at Wendy request of her family members.   DIET: Regular.   ACTIVITY: As tolerated.   FOLLOWUP: Follow up with Dr. Lonna Cobb and PCP in 1 to 2 weeks after discharge.   ACTIVITY: As tolerated.   REFERRALS: Discharged home with home health and PT.  DISCHARGE MEDICATIONS:  1. Tapentadol 50 mg every six hours p.r.n.  2. Zofran 4 mg every six hours p.r.n.  3. Cipro 500 mg b.i.d. for seven days.   CONSULTATIONS:  1. Gastroenterology consultation with Dr. Niel Hummer.  2. Urology consultation with Dr. Lonna Cobb.   LABORATORY, DIAGNOSTIC, AND RADIOLOGICAL DATA: CT of Wendy abdomen showed nodular area of density in Wendy lung which is nonspecific. Mild left hydronephrosis secondary to a 6.7 mm stone at Wendy left ureteropelvic junction. Additional left renal calculi are present. Two small right renal calculi. Stable prominence of Wendy CBD. Colonic diverticulosis. Echo: Essentially normal study. Left ventricular systolic function more than 55%. Right ventricular systolic function is normal. CEA normal. CA 19-9 normal. Initial urine culture sent on 10/07 showed gram-positive rods and coagulase-negative staph which are not urinary pathogens. Blood cultures negative so far. Urine culture 10/09: Colonies too small to read. Normal white count. Hemoglobin 11.9, platelet count ranging from 52 to 81, creatinine 2.61 on admission, normal by Wendy time of discharge. Sodium  148, normal by Wendy time of discharge, potassium 3.3, normal by Wendy time of discharge. ALT 190 on admission, 181 by Wendy time of discharge. AST 117 on admission, 28 by Wendy time of discharge. Minimally elevated cardiac enzymes 0.18 to 0.35. TSH normal.   HOSPITAL COURSE: [name] is an 81 year old female with history of dementia who was brought in by her family member for back pain, weakness and lethargy. She was found to have acute renal failure. A CT scan of Wendy abdomen showed bilateral kidney stones, more on Wendy left side. She also had left hydronephrosis with a 6.7 mm UV junction stone. She likely had obstructive uropathy due to her kidney stones compounded by dehydration and poor oral intake. A urology consultation with Dr. Lonna Cobb was obtained and [name] underwent a cystoscopy with bladder biopsy and stent placement on 12/11/2011. Since then her kidney function has normalized. Dr. Heywood Footman office will arrange for outpatient lithotripsy for Wendy rest of [name]'s kidney stones. Initial urine cultures sent on 12/09/2011 grew gram-positive rods and coagulase-negative staph which are not urinary pathogens. Repeat cultures done on 12/11/2011 shows colonies too small to read. Her blood cultures have been negative so far. She is going to be treated with empiric Cipro as per Dr. Lonna Cobb until her cultures are finalized. [name] had chronic stable thrombocytopenia. She is not on any anticoagulants. Her anemia of chronic disease remained stable. She had mild hyponatremia and hypokalemia which have resolved. She was found to have elevated ALT and AST. She is status post cholecystectomy. A gastroenterology consult with Dr. Niel Hummer was obtained who recommended conservative management. Without any intervention, [name]'s AST and  ALT have improved. Possible contributing causes could be antibiotic use, urinary tract infection, recent history of cholecystectomy and a CEA and CA-19-9 were checked and were  normal. [name] had minimally elevated troponins, possibly due to demand ischemia versus poor renal clearance. Her echo showed normal ejection fraction. [name] was evaluated during Wendy hospitalization by a physical therapist who recommended short-term rehab. Placement was offered to Wendy family, but they insisted on taking [name] home with home health.   TIME SPENT: 45 minutes.    ____________________________ Darrick MeigsSangeeta Taequan Stockhausen, MD sp:ap D: 12/12/2011 12:48:41 ET T: 12/12/2011 13:55:50 ET JOB#: [id number]  cc: Darrick MeigsSangeeta Hughey Rittenberry, MD, <Dictator> Scott C. Lonna CobbStoioff, MD Darrick MeigsSANGEETA Mysty Kielty MD ELECTRONICALLY SIGNED 12/13/2011 12:09

## 2014-06-21 NOTE — Consult Note (Signed)
For left ureteral stent placement today.questions answered and she desires to proceed  Electronic Signatures: Riki AltesStoioff, Francheska Villeda C (MD)  (Signed on 09-Oct-13 09:29)  Authored  Last Updated: 09-Oct-13 09:29 by Riki AltesStoioff, [place] Firkus C (MD)

## 2014-06-21 NOTE — Consult Note (Signed)
PATIENT NAME:  [name], [name] DATE OF BIRTH:  Oct 21, 1922  DATE OF CONSULTATION:  12/10/2011  REFERRING PHYSICIAN:  Darrick MeigsSangeeta Panwar, MD CONSULTING PHYSICIAN:  Scott C. Stoioff, MD  REASON FOR CONSULTATION: Left renal calculus/flank pain.  CHIEF COMPLAINT: Back pain.  HISTORY OF PRESENT ILLNESS: This is an 81 year old female who presented to the emergency department 12/09/2011 complaining of lethargy. Her daughter checked her blood pressure, which was in the upper 80s systolic. She had had decreased p.o. intake. She was found to have a creatinine in the mid 2 range and was admitted for further evaluation. She does complain of chronic left flank pain, but this has been worse recently. She is followed by Dr. Achilles Dunkope for recurrent urinary tract infections and incontinence. He performed a cystoscopy in July which did show abnormal, erythematous mucosa at the bladder base trigone region. A urine cytology was negative. She is scheduled for cystoscopy later this month with a plan on biopsying if the abnormalities were still present. Her admission CT did show nephrolithiasis and an approximately 7 mm left UPJ stone with hydronephrosis. Admission urinalysis did show pyuria and microhematuria, although her initial urine culture is negative.   PAST MEDICAL HISTORY:  1. Hypothyroidism. 2. Degenerative arthritis.  3. Recurrent urinary tract infection.   PAST SURGICAL HISTORY:  1. Cataract removal. 2. Tonsillectomy.  ADMISSION MEDICATIONS: Aleve every 8 hours p.r.n.   ALLERGIES: Penicillin.  REVIEW OF SYSTEMS: GENERAL: Denies fever. Positive fatigue. EYES: No visual changes. EARS, NOSE AND THROAT: Decreased hearing. PULMONARY: Denies cough or shortness of breath. CARDIOVASCULAR: No chest pain or palpitations. GI: Denies nausea and vomiting. Has decreased p.o. intake. GENITOURINARY: As per the history of present illness. ENDOCRINE: History of hypothyroidism. MUSCULOSKELETAL: Positive back  pain. NEUROLOGIC: No cerebrovascular accident/seizure. PSYCH: No depression or anxiety.  PHYSICAL EXAMINATION:   GENERAL: Cooperative female in no acute distress.   VITAL SIGNS: Temperature 98, blood pressure 116/60, and pulse 68.   ABDOMEN: Soft. There is mild left lower quadrant tenderness.   BACK: No CVA tenderness.   DATA: Preliminary urine culture is negative. Creatinine on admission 2.6, repeat today is 2.0.   Urinalysis: 38 RBC, 56 WBC, nitrite negative.  Preliminary urine culture is negative.  CT scan of the abdomen and pelvis without contrast was reviewed. There is an approximately 7 mm left UPJ stone with mild to moderate hydronephrosis. There are bilateral renal calculi present in addition.   IMPRESSION:  1. A 7 mm left UPJ stone with left flank pain, hydronephrosis.  2. History of recurrent urinary tract infection and abnormal cystoscopy.   RECOMMENDATIONS: I discussed with the patient and her family the 7 mm UPJ stone, which will be too large to pass. Based on her symptoms, history of urinary tract infection and the possibility of obstruction behind the stone, I would not recommend in situ lithotripsy and have recommended initial cystoscopy with left ureteral stent placement. Shockwave lithotripsy can be performed at a later date. We will plan on obtaining urine from the renal pelvis for culture, intraoperatively. She is scheduled for cystoscopy later this month. If the bladder abnormalities are still persistent, we will plan on biopsy at the time. The procedure was discussed with the patient and the potential risks including bleeding and infection. The unlikely possibility of an     impacted stone with inability to place the stent was discussed, which would require percutaneous nephrostomy. She indicated all questions were answered to her satisfaction and desired to proceed.  ____________________________ Verna CzechScott C.  Lonna Cobb, MD scs:slb D: 12/10/2011 14:13:15  ET T: 12/10/2011 14:48:15 ET JOB#: [id number]  cc: Lorin Picket C. Lonna Cobb, MD, <Dictator> Riki Altes MD ELECTRONICALLY SIGNED 12/11/2011 15:28

## 2014-06-21 NOTE — H&P (Signed)
PATIENT NAME:  [name], [name] MR#:  [id number] DATE OF BIRTH:  Nov 13, 1922  DATE OF ADMISSION:  12/09/2011  ADDENDUM  Patient recently was treated for urinary tract infection, now has no white count, afebrile so will hold off on the antibiotics. Follow the final urine culture report and get the urine culture report from Dr. Hillery AldoSarah Patel also, so hold off on the antibiotics for now.   ____________________________ Wendy HammingSnehalatha Roben Schliep, MD sk:cms D: 12/09/2011 20:46:10 ET T: 12/10/2011 06:48:16 ET JOB#: [id number]  cc: Wendy HammingSnehalatha Minna Dumire, MD, <Dictator> Wendy HammingSNEHALATHA Pansy Ostrovsky MD ELECTRONICALLY SIGNED 01/14/2012 15:44

## 2014-06-21 NOTE — Consult Note (Signed)
Brief Consult Note: Diagnosis: left upj stone.   Patient was seen by consultant.   Consult note dictated.   Orders entered.   Comments: Plan stent placement 10/8.  Electronic Signatures: Riki AltesStoioff, Scott C (MD)  (Signed 08-Oct-13 18:24)  Authored: Brief Consult Note   Last Updated: 08-Oct-13 18:24 by Riki AltesStoioff, Scott C (MD)

## 2014-06-24 NOTE — Discharge Summary (Signed)
PATIENT NAME:  [name], Wendy MR#:  [id number] DATE OF BIRTH:  [birth year]  DATE OF ADMISSION:  01/29/2013 DATE OF DISCHARGE:  01/31/2013  DISCHARGE DIAGNOSES: 1.  Enterococcus bacteremia.  2.  Acute renal failure.  3.  Dehydration.  4.  Enterococcus urinary tract infection.  5.  Chronic nephrolithiasis. .  6.  Degenerative joint disease.  7.  Debility.    IMAGING STUDIES: Include a chest x-ray PA and lateral, showed some mild atelectasis.  tetanus. No other acute abnormalities.   Blood culture results in the hospital have been negative. Urine culture results showed Enterococcus faecalis sensitive to the linezolid.   DISCHARGE MEDICATIONS: Which include; 1.  Acetaminophen 325 mg 2 tablets orally every six hours as needed for fever.  2.  Zyvox 600 mg oral 2 times a day for 10 more days.   ADMITTING HISTORY AND PHYSICAL AND HOSPITAL COURSE:  Please see detailed H and P dictated by Dr. Allena KatzPatel.  Previously, in brief, an 81 year old female patient with history of recurrent UTIs, who was recently diagnosed with enterococcal bacteremia, was initially requested to come to the hospital, but the patient and daughter did not want to be admitted and was started on outpatient p.o. Zyvox. The patient slowly got worse with weakness. On presentation to the Emergency Room, had acute renal failure with creatinine increasing to 2.10 from baseline of 1.  The patient was admitted with weakness, acute renal failure, enterococcus bacteremia, started on IV Zyvox. Repeat blood cultures were drawn along with urine cultures. Blood cultures are negative.  Urine cultures grew Enterococcus faecalis sensitive to linezolid. The patient was switched back to p.o. Zyvox for 10 more days with improving kidney function, creatinine back to normal. The patient feeling back to baseline and is being discharged back home.   DISCHARGE INSTRUCTIONS:  Regular food. Regular diet. Activity as tolerated. Follow up with primary care physician  in a week. Finish course of Zyvox. Return to the Emergency Room if there is any fever or any further deterioration.   Time spent on day of discharge in discharge activity was 45 minutes.   ____________________________ Molinda BailiffSrikar R. Jaleeyah Munce, MD srs:NTS D: 02/05/2013 02:45:19 ET T: 02/05/2013 02:59:38 ET JOB#: [id number]  cc: Wardell HeathSrikar R. Elpidio AnisSudini, MD, <Dictator> Sarah "Sallie" Allena KatzPatel, MD Orie FishermanSRIKAR R Sandia Pfund MD ELECTRONICALLY SIGNED 02/10/2013 14:06

## 2014-06-24 NOTE — Discharge Summary (Signed)
PATIENT NAME:  Wendy HummerMITCHELL, Haruna MR#:  [id number] DATE OF BIRTH:  08/06/1922  DATE OF ADMISSION:  01/21/2013 DATE OF DISCHARGE:  01/22/2013  PRESENTING COMPLAINT: Fever of 103.2.   DISCHARGE DIAGNOSES:  1.  Systemic inflammatory response syndrome secondary to urinary tract infection, recurrent.  2.  Hypertension, improved.  3.  History of chronic nephrolithiasis.   MEDICATIONS AT DISCHARGE:  1.  Bactrim double strength 1 tablet b.i.d., complete the prescription you have at home.  2.  Tylenol 325 mg 2 tablets q.6 p.r.n.   Resume home health physical therapy.   DIET: Regular.   FOLLOWUP: With [facility]. Hillery AldoSarah Naidelyn Parrella [facility] in 1 to 2 weeks.   LABS AT DISCHARGE: Glucose 71, BUN 20, creatinine 1.5, sodium 140, potassium 3.9, chloride 113, bicarbonate is 24 and calcium is 7.9. UA positive for UTI. The urine culture is positive for Enterococcus faecalis. Blood culture is positive for gram-positive cocci in aerobic and anaerobic bottles. The blood culture results were reported to the Emergency Room and an addendum has been placed by the ER nurse in SCM.   BRIEF SUMMARY OF HOSPITAL COURSE:  1.  Wendy HummerCallie Sharrow is an 81 year old Caucasian female with recurrent UTIs, history of chronic nephrolithiasis, who comes to the Emergency Room with high-grade fever of 103. She was admitted with SIRS secondary to acute pyelonephritis/urinary tract infection. She remained afebrile, although did have high-grade fever in the Emergency Room. She received Tylenol. Urine culture from outpatient lab showed Morganella morganii. She was started on Bactrim per her primary care physician, which she will finish up a 10 day course. Blood pressure remained stable after IV fluids. I discussed with daughter, who is requesting for patient, since she is doing well, to take her home.  2.  Chronic nephrolithiasis. The patient [facility]. She recently had a CT scan of her abdomen per daughter. Scan appears to be  stable per urology.  3.  Degenerative joint disease. P.r.n. Tylenol as needed. She uses a walker at home. She ambulated well with nursing staff.  4.  Please note, at the time of doing the discharge summary, noticed blood cultures were positive for Enterococcus faecalis. There is a note from ER nurse, who had notified Dr. Marita Snellencorbach of the positive blood culture results. I will try to contact family members to ensure she is doing well and if not will get her back to the hospital for further evaluation and management.   CODE STATUS: The patient remained a full code.  ____________________________ Wylie HailSona A. Allena KatzPatel, MD sap:aw D: 01/25/2013 06:55:28 ET T: 01/25/2013 07:09:07 ET JOB#: [id number]  cc: Brittish Bolinger A. Allena KatzPatel, MD, <Dictator> Sarah "Sallie" Allena KatzPatel, MD Willow OraSONA A Jenniferann Stuckert MD ELECTRONICALLY SIGNED 01/25/2013 14:17

## 2014-06-24 NOTE — H&P (Signed)
PATIENT NAME:  [name], [name] MR#:  [id number] DATE OF BIRTH:  Sep 08, 1922  DATE OF ADMISSION:  01/21/2013  PRIMARY CARE PHYSICIAN: Dr. Hillery Aldo at [facility].  UROLOGY: [facility].   CHIEF COMPLAINT: Fever of 103.2.   HISTORY OF PRESENT ILLNESS: [name] is a pleasant 81 year old Caucasian female with history of chronic nephrolithiasis, who follows with [facility], history of arthritis, hypothyroidism, comes into the Emergency Room after [name] started having extreme weakness in both her lower extremities when family tried to get her to go use the bathroom. The patient has had urological work-up for recurrent UTIs. [name] has chronic kidney stones, and recently last three weeks ago [name] was started to do as needed in and out catheter since her symptoms of incomplete emptying of her bladder. The patient has been trying to learn to do it and apparently the daughter thinks [name] did not use sterile techniques and possibly started getting UTIs. [name] was started on some p.o. antibiotics by her primary care physician; however, [name] continued to have fever of 103.2, a little low on blood pressure and was found to be hypotensive and very weak. [name] is being admitted. [name] also complained of significant back pain and nausea. [name] is being admitted for SIRS secondary to urinary tract infection/acute pyelonephritis. The patient's urinalysis is positive for urinary tract infection. In the Emergency Room, [name] received Rocephin.   ALLERGIES: [name] HAS LISTED ALLERGY TO PENICILLIN.   PAST MEDICAL HISTORY: 1. Chronic UTIs on and off.  2. Chronic nephrolithiasis.  3. Arthritis.  4. Hypothyroidism.  5. Status post gallbladder surgery for gallstone removal.   SOCIAL HISTORY: [name] lives at home with her daughter, ambulates using a walker. [name] has been started doing in and out catheter for the last three weeks, which daughter helps her. Nonsmoker, nonalcoholic.  MEDICATIONS: None  per daughter.    FAMILY HISTORY: Positive for hypertension.   REVIEW OF SYSTEMS:  CONSTITUTIONAL: Positive for fever, fatigue, weakness.  EYES: No blurred or double vision.  ENT: No tinnitus, ear pain, hearing loss.  RESPIRATORY: No cough, wheeze, hemoptysis.  CARDIOVASCULAR: No chest pain, orthopnea, edema.  GASTROINTESTINAL: Positive for nausea. No vomiting, diarrhea. Positive for some back pain at the costovertebral angle. No abdominal mass.  GENITOURINARY: Positive for incontinence. No dysuria or hematuria.  ENDOCRINE: No polyuria or nocturia. Positive for hypothyroidism.  MUSCULOSKELETAL: Positive for joint arthritis, back pain. No gout. NEUROLOGIC: No CVA, transient ischemic attack, tremors or vertigo.  SKIN: No acne, rash.  HEMATOLOGY: No bleeding disorder. No anemia or easy bruising.  PSYCHIATRIC: The patient is no depression, anxiety or bipolar disorder. All other systems reviewed and negative.   Urinalysis positive for urinary tract infection. Blood cultures negative in 8 to 12 hours. CBC within normal limits, except platelet count of 137. Glucose 93, BUN is 16, sodium is 140, potassium 3.7, chloride 110. LFTs within normal limits. Albumin is 3.0. The urine culture is pending.   ASSESSMENT: An 81 year old [place] with history of recurrent UTIs. Chronic, nephrolithiasis and hypothyroidism, comes into the Emergency Room with high-grade fever of 103. [name] is being admitted with:  1. Systemic inflammatory response syndrome secondary to acute pyelonephritis/urinary tract infection. The patient is currently afebrile, although did have high-grade fever in the Emergency Room. We will give Tylenol p.r.n. around the clock IV, fluids for hydration. Switched the patient to IV Cipro b.i.d. for urinary tract infection/pyelonephritis. Give pain medication IV p.r.n. Follow blood cultures, urine cultures.  2. Chronic nephrolithiasis. The patient  [facility]. [name] recently had a CT scan of  her abdomen. Per daughter, her scan appears to be stable per urology. We will continue to monitor.  3. The patient will complete up a course of antibiotic treatment for her current symptoms of urinary tract infection.  4. Degenerative joint disease, p.r.n. Tylenol as needed. The patient will be seen by physical therapy. [name] uses a walker at home.  5. Hypothyroidism. The patient is not taking any medications per daughter. [name] says [name] does not need it at this time. [name] is not on any chronic home meds.  6. Deep venous thrombosis prophylaxis. We will give her subcutaneous Lovenox.  7. Further work-up according to the patient's clinical course. Hospital admission plan was discussed with the patient and the patient's family members.  8. Physical therapy will evaluate the patient.  9. Social worker and care management for discharge planning.    TIME SPENT: 50 minutes.   ____________________________ Wylie HailSona A. Allena KatzPatel, MD sap:sg D: 01/21/2013 12:26:50 ET T: 01/21/2013 12:51:09 ET JOB#: [id number]  cc: Jackeline Gutknecht A. Allena KatzPatel, MD, <Dictator> Sarah "Sallie" Allena KatzPatel, MD [facility]  Willow OraSONA A Adeliz Tonkinson MD ELECTRONICALLY SIGNED 01/25/2013 14:16

## 2014-06-24 NOTE — H&P (Signed)
PATIENT NAME:  [name], [name] MR#:  [id number] DATE OF BIRTH:  August 30, 1922  DATE OF ADMISSION:  01/29/2013  PRESENTING COMPLAINT: Low-grade fever and unable to urinate.   HISTORY OF PRESENT ILLNESS: [name] is an 81 year old Caucasian female who was recently admitted on 20th of November, discharged 21st of November, with UTI, on p.o. Bactrim. Thereafter, blood cultures grew positive for Enterococcus faecalis, and the patient was changed to p.o. Zyvox, which she has been taking since 25th of November twice a day. Per daughter, the patient has been eating okay; however, she had some difficulty urinating yesterday, and she intermittently uses catheterization in and out. She was also noted to have temperature of 94.7 and varied from 94.7 to 97.9 at home. She was brought to the Emergency Room, where her temperature today was 95.9. Blood pressure was 108/53. Her pulse is 56, and she is being admitted for possible sepsis secondary to UTI, likely Enterococcus.   PAST MEDICAL HISTORY:  1. Recurrent UTI with history of kidney stones, most recent one was November 21st.  2. History of hypothyroidism, not on any medication.  3. Arthritis.  4. Status post gallbladder surgery for gallstone removal.   SOCIAL HISTORY: Lives at home with her daughter.  FAMILY HISTORY: Positive for hypertension.   REVIEW OF SYSTEMS:  CONSTITUTIONAL: Positive for fatigue and weakness. No fever.  EYES: No blurred or double vision. No glaucoma.  ENT: No tinnitus, ear pain, hearing loss.  RESPIRATORY: No cough, wheeze, hemoptysis or shortness of breath.  CARDIOVASCULAR: No chest pain, orthopnea, edema or hypertension.  GASTROINTESTINAL: No nausea, vomiting, diarrhea. No abdominal pain.  GENITOURINARY: No dysuria or hematuria. Positive for urinary retention, intermittent.  ENDOCRINE: No polyuria or nocturia. Positive for hypothyroidism.  MUSCULOSKELETAL: Positive for arthritis and joint pain and back pain. No gout or swelling  of joints.  NEUROLOGIC: No CVA, TIA, tremors or vertigo.  SKIN: No acne, rash or lesion.  HEMATOLOGY: No bleeding disorder. No anemia or easy bruising.  PSYCHIATRIC: No depression, anxiety, bipolar disorder.  All other systems reviewed and negative.   PHYSICAL EXAMINATION:  GENERAL: The patient is awake, alert, oriented x3, not in acute distress.  VITAL SIGNS: Temperature 95.9, pulse is 56, blood pressure is 108/53, sats are 95% on room air.  HEENT: Atraumatic, normocephalic. PERRLA. EOM intact. Oral mucosa is moist.  NECK: Supple. No JVD. No carotid bruit.  RESPIRATORY: Clear to auscultation bilaterally. No rales, rhonchi, respiratory distress or labored breathing.  CARDIOVASCULAR: Both the heart sounds are normal. Rate and rhythm regular. PMI not lateralized. Chest nontender. Good pedal pulses, good femoral pulses. No lower extremity edema.  ABDOMEN: Soft, benign, nontender. No organomegaly. Positive bowel sounds. No abdominal mass. No guarding or rigidity.  SKIN: Warm and dry.  PSYCHIATRIC: The patient is awake, alert, oriented x3.   IMAGING: Chest x-ray: No acute cardiopulmonary abnormality.   LABORATORY DATA: White count is 15.1, H and H is 10.2 and 30.1, platelet count is 164. Glucose is 66, BUN is 25, creatinine 2.10, sodium 132, potassium is 4.2, chloride is 102, bicarbonate is 23, calcium is 8.1, bilirubin is 0.6, alkaline phosphatase is 233, SGPT 20, SGOT is 47, albumin is 2.0. UA positive for 3+ leukocyte esterase, rare RBCs and 0 to 5 WBCs.    ASSESSMENT: The 81 year old [place] Hummer, with history of recurrent urinary tract infections and nephrolithiasis, comes in with temperature of 95.6, lower abdominal discomfort and low blood pressure. She is being admitted with:   1. Ongoing sepsis with Enterococcus faecalis.  Source appears to be urine. The patient had been on Zyvox 600 b.i.d. since November 25th. Will continue IV Zyvox for now. Repeat blood cultures and urine culture. The  patient will be given some IV fluids for her dehydration. Will monitor fever curve. Monitor WBC as well.  2. Recurrent urinary tract infection, most recent one is Enterococcus. Was started on linezolid 600 b.i.d. per cultures from 20th of November.  3. Hypothyroidism, not on any medication.  4. Acute on chronic renal failure. The patient's baseline is around 1.5. Creatinine today she comes in with 2.10. Will give her some IV fluids. Monitor I's and O's and creatinine closely.  5. Deep vein thrombosis prophylaxis. The patient and family do not want any antiplatelet agents. Will continue SCDs and TEDs.  6. Physical therapy to see the patient.   CODE STATUS: The patient is a no code, DNR. This was discussed with the patient's daughter, [name], and she is agreeable to it.   TIME SPENT: 50 minutes.   ____________________________ Wylie HailSona A. Allena KatzPatel, MD sap:lb D: 01/29/2013 13:02:39 ET T: 01/29/2013 13:27:53 ET JOB#: [id number]  cc: Suda Forbess A. Allena KatzPatel, MD, <Dictator> Sarah "Sallie" Allena KatzPatel, MD Willow OraSONA A Stellarose Cerny MD ELECTRONICALLY SIGNED 02/12/2013 11:06

## 2014-06-25 NOTE — Consult Note (Signed)
Referring Physician:  Albertine Patricia   Primary Care Physician:  Albertine Patricia [place], [address] [place], [place] [address], [place] [phone]  Reason for Consult: Admit Date: 17-May-2013  Chief Complaint: seizure  Reason for Consult: seizure   History of Present Illness: History of Present Illness:   81 yo RHD F presents to [facility] secondary to being found down by family wedged between furniture.  After being found down, pt was noted to stiffen up and have some tonic type movement with her eyes rolled back.  This first episodes occured with urinary incontinence and then some confusion for a little afterward.  Pt had two more episodes that were witnessed in the ER.  No prior history of seizures prior.  Pt has normal ambulatory baseline where she does most of her ADLs except for cooking meals, driving or paying bills.  She had mild memory problems prior.  ROS:  General denies complaints    HEENT no complaints    Lungs chest wall pain    Cardiac no complaints    GI no complaints    GU burning with urination    Musculoskeletal muscle ache    Extremities no complaints    Skin no complaints    Neuro no complaints    Endocrine no complaints    Psych no complaints    Past Medical/Surgical Hx:  UTI:   Kidney Stones:   Arthritis:   Hypothyroidism:   Gallstones Removed:   Past Medical/ Surgical Hx:  Past Medical History as above   Past Surgical History as above   Allergies:  Penicillin: Rash  Rocephin: Other  Allergies:  Allergies as above    Social/Family History: Employment Status: retired  Lives With: children  Living Arrangements: house  Social History: no tob, no EtOH, no illicits  Family History: no seizures or strokes   Vital Signs: **Vital Signs.:   16-Mar-15 13:32  Vital Signs Type Routine  Temperature Temperature (F) 97.5  Celsius 36.3  Temperature Source oral  Pulse Pulse 75  Respirations  Respirations 18  Systolic BP Systolic BP 194  Diastolic BP (mmHg) Diastolic BP (mmHg) 78  Mean BP 88  Pulse Ox % Pulse Ox % 96  Pulse Ox Activity Level  At rest  Oxygen Delivery 2L   Physical Exam: General: thin, mild distress in bed  HEENT: normocephalic, sclera nonicteric, oropharynx clear  Neck: supple, no JVD, no bruits  Chest: CTA B, no wheezing, good movement  Cardiac: RRR, no murmurs, no edema, 2+ pulses  Extremities: no C/C/E, limited upward movements of arms and L leg immobilized in traction   Neurologic Exam: Mental Status: alert and oriented x 2 not time, normal speech and language, follows complex commands  Cranial Nerves: PERRLA, EOMI, nl VF, face symmetric, tongue midline, shoulder shrug equal  Motor Exam: 3/5 B UE but limited due to pain in neck and chest, nl tone, no fasciculations  Deep Tendon Reflexes: 1+/4 B, downgoing plantars  Sensory Exam: pinprick, temperature, and vibration intact B  Coordination: untestable due to pain   Lab Results:  Thyroid:  15-Mar-15 21:46   Thyroid Stimulating Hormone  9.44 (0.45-4.50 (International Unit)  ----------------------- Pregnant patients have  different reference  ranges for TSH:  - - - - - - - - - -  Pregnant, first trimetser:  0.36 - 2.50 uIU/mL)  Routine BB:  15-Mar-15 21:46   ABO Group + Rh Type O Positive  Antibody Screen NEGATIVE (  Result(s) reported on 16 May 2013 at 10:51PM.)  Routine Chem:  15-Mar-15 21:46   Magnesium, Serum 2.0 (1.8-2.4 THERAPEUTIC RANGE: 4-7 mg/dL TOXIC: > 10 mg/dL  -----------------------)  16-Mar-15 03:56   Potassium, Serum  3.4  Glucose, Serum  207  BUN 14  Creatinine (comp) 1.12  Sodium, Serum 142  Chloride, Serum  111  CO2, Serum 21  Calcium (Total), Serum  7.7  Anion Gap 10  Osmolality (calc) 290  eGFR (African American)  51  eGFR (Non-African American)  44 (eGFR values <4m/min/1.73 m2 may be an indication of chronic kidney disease (CKD). Calculated eGFR is useful in  patients with stable renal function. The eGFR calculation will not be reliable in acutely ill patients when serum creatinine is changing rapidly. It is not useful in  patients on dialysis. The eGFR calculation may not be applicable to patients at the low and high extremes of body sizes, pregnant women, and vegetarians.)  Routine UA:  15-Mar-15 21:55   Color (UA) Yellow  Clarity (UA) Cloudy  Glucose (UA) Negative  Bilirubin (UA) Negative  Ketones (UA) Negative  Specific Gravity (UA) 1.009  Blood (UA) 2+  pH (UA) 7.0  Protein (UA) 100 mg/dL  Nitrite (UA) Negative  Leukocyte Esterase (UA) 3+ (Result(s) reported on 16 May 2013 at 10:35PM.)  RBC (UA) 35 /HPF  WBC (UA) 109 /HPF  Bacteria (UA) 1+  Epithelial Cells (UA) 1 /HPF  WBC Clump (UA) PRESENT (Result(s) reported on 16 May 2013 at 10:35PM.)  Routine Coag:  15-Mar-15 21:46   Prothrombin 13.1  INR 1.0 (INR reference interval applies to patients on anticoagulant therapy. A single INR therapeutic range for coumarins is not optimal for all indications; however, the suggested range for most indications is 2.0 - 3.0. Exceptions to the INR Reference Range may include: Prosthetic heart valves, acute myocardial infarction, prevention of myocardial infarction, and combinations of aspirin and anticoagulant. The need for a higher or lower target INR must be assessed individually. Reference: The Pharmacology and Management of the Vitamin K  antagonists: the seventh ACCP Conference on Antithrombotic and Thrombolytic Therapy. CVXBLT.9030Sept:126 (3suppl): [id number] A HCT value >55% may artifactually increase the PT.  In one study,  the increase was an average of 25%. Reference:  "Effect on Routine and Special Coagulation Testing Values of Citrate Anticoagulant Adjustment in Patients with High HCT Values." American Journal of Clinical Pathology 2006;126:400-405.)  Activated PTT (APTT) 35.6 (A HCT value >55% may artifactually increase the  APTT. In one study, the increase was an average of 19%. Reference: "Effect on Routine and Special Coagulation Testing Values of Citrate Anticoagulant Adjustment in Patients with High HCT Values." American Journal of Clinical Pathology 2006;126:400-405.)  Routine Hem:  15-Mar-15 21:46   WBC (CBC) 7.4  RBC (CBC) 3.80  Hemoglobin (CBC)  11.8  Hematocrit (CBC)  34.3  Platelet Count (CBC) 152 (Result(s) reported on 16 May 2013 at 10:25PM.)  MCV 90  MCH 31.0  MCHC 34.4  RDW 13.7   Radiology Results: CT:    15-Mar-15 22:34, CT Head Without Contrast  CT Head Without Contrast   REASON FOR EXAM:    fall, loc head injury  COMMENTS:       PROCEDURE: CT  - CT HEAD WITHOUT CONTRAST  - May 16 2013 10:34PM     CLINICAL DATA:  Fall.    EXAM:  CT HEAD WITHOUT CONTRAST    CT CERVICAL SPINE WITHOUT CONTRAST    TECHNIQUE:  Multidetector CT imaging of the head  and cervical spine was  performed following the standard protocol without intravenous  contrast. Multiplanar CT image reconstructions of the cervical spine  were also generated.    COMPARISON:  None.    FINDINGS:  CT HEAD FINDINGS    No mass. No hydrocephalus. No hemorrhage. Diffuse cerebral and  cerebellar atrophy. White matter changes consistent with chronic  ischemia. Orbits are unremarkable. Visualized paranasal sinuses and  mastoids are clear. No acute bony abnormality.    CT CERVICAL SPINE FINDINGS  Diffuse degenerative change. There is no fracture or dislocation.  Pulmonary apices are clear.     IMPRESSION:  1. No acute intracranial abnormality. Diffuse cerebral atrophy.  White matter changes consistent with chronic ischemia.  2. Diffuse cervical spine degenerative change. No evidence of  fracture or dislocation.      Electronically Signed    By: Marcello Moores  Register    On: 05/16/2013 22:43       Verified By: Osa Craver, M.D., MD   Radiology Impression: Radiology Impression: CT of head personally  reviewed by me and normal   Impression/Recommendations: Recommendations:   prior notes reviewed by me reviewed by me    Seizure-  most likely provoked by UTI and likely not recur after treatment Probable cervical injury-  pt had a fall with significant pain and weakness on exam Muscle tenderness-  no etiology MRI of cervical spine C-collar until cleared continue dilantin 35m qHS, check level check CPK and myoglobin continue bedrest for now will follow  Electronic Signatures: SJamison Neighbor(MD)  (Signed 17-Mar-15 14:30)  Authored: REFERRING PHYSICIAN, Primary Care Physician, Consult, History of Present Illness, Review of Systems, PAST MEDICAL/SURGICAL HISTORY, HOME MEDICATIONS, ALLERGIES, Social/Family History, NURSING VITAL SIGNS, Physical Exam-, LAB RESULTS, RADIOLOGY RESULTS, Recommendations   Last Updated: 17-Mar-15 14:30 by SJamison Neighbor(MD)

## 2014-06-25 NOTE — H&P (Signed)
PATIENT NAME:  [name], [name] MR#:  [id number] DATE OF BIRTH:  November 05, 1922  DATE OF ADMISSION:  05/16/2013  REFERRING PHYSICIAN:  Dr. Sharman CheekPhillip Fisher.   PRIMARY CARE PHYSICIAN[facility]   CHIEF COMPLAINT: Fall.   HISTORY OF PRESENT ILLNESS: This is an 81 year old female with significant past medical history of recurrent UTI and history of kidney stones with multiple admissions, most recent in November 2014, where her urine and blood cultures were growing enterococcus, discharged home on Zyvox. The patient presents here today with status post fall. The patient is poor historian. Cannot recall the exact event of the fall. She was found on the floor by her daughter. When she was brought to ED, she was noticed to have left hip fracture. The patient had CT head, CT of cervical spine, x-ray of the right arm and x-ray of the left hip; thus, she has bruises on her right arm as well. CT head and cervical spine did not show any acute findings. She does have her intertrochanteric left hip fracture as well. As the patient's UTI came back positive, she was started on Rocephin for that. Urine culture is still pending. The patient denies any chest pain, any shortness of breath, any fever, any chills, any fatigue, any dysuria, polyuria. The patient's labs were significant for hypokalemia; potassium of 3. The patient had CT abdomen and pelvis. She is known to have history of kidney stones in the past. CT of abdomen and pelvis did not show any significant changes from previous; however, it he did find her to have moderate left hydronephrosis due to large stone at the left ureteropelvic junction. The patient is a very poor historian. Daughter was giving most of the history. Reported the fall was unwitnessed; basically, she saw her on the floor. Does not know when did she fall. She thinks she might be standing from the bed, but she found her unresponsive on the floor, but the patient was starting to  become conscious and back to her baseline when EMS were present.   PAST MEDICAL HISTORY: 1.  Recurrent UTI with history of kidney stones.  2.  History of kidney stones.  3.  History of hypothyroidism, not on any medication. 4.  Arthritis.  5.  Status post gallbladder surgery.   SOCIAL HISTORY: Lives at home with her daughter. No alcohol. No smoking. No family history significant for hypertension.   ALLERGIES: PENICILLIN.   HOME MEDICATIONS: None.   REVIEW OF SYSTEMS: The patient complains of fatigue, weakness. Denies fevers, chills.  EYES: Denies blurry vision, double vision, inflammation, glaucoma.  ENT: Denies tinnitus, ear pain, hearing loss, epistaxis or discharge.  RESPIRATORY: Denies cough, wheezing, hemoptysis, shortness of breath.  CARDIOVASCULAR: Denies chest pain, edema, arrhythmia, palpitation, syncope.  GASTROINTESTINAL: Denies nausea, vomiting, diarrhea, abdominal pain.  GENITOURINARY: Denies dysuria, hematuria, renal colic.  ENDOCRINE: Denies polyuria, polydipsia, heat or cold intolerance.  HEMATOLOGY: Denies anemia, easy bruising, bleeding diathesis.  INTEGUMENTARY: Denies acne, rash or skin lesion.  MUSCULOSKELETAL: Denies any gout, cramps. Reports history of arthritis.  NEUROLOGIC: Denies history of CVA, TIA, seizures, headache, tremors, vertigo.  PSYCHIATRIC: Denies anxiety, insomnia or depression.   PHYSICAL EXAMINATION: VITAL SIGNS: Temperature 97, pulse 58, respiratory rate 18, blood pressure 145/63, saturating 96% on room air.  GENERAL: Elderly female who looks comfortable, in no apparent distress.  HEENT: Head atraumatic, normocephalic. Pupils equal and reactive to light. Pink conjunctivae. Anicteric sclerae. Moist oral mucosa.  NECK: Supple. No thyromegaly. No JVD.  CHEST:  Good air entry bilaterally. No wheezing, rales, rhonchi.  CARDIOVASCULAR: S1, S2 heard. No rubs, murmurs or gallops.  ABDOMEN: Soft, nontender, nondistended. Bowel sounds present.   EXTREMITIES:  No edema. No clubbing. No cyanosis. Pedal pulses +2 bilaterally.  MUSCULOSKELETAL: Has left lower extremity shorter and externally rotated  SKIN: Has a skin tear in the right hand.  LYMPHATICS: No cervical lymphadenopathy.   PERTINENT LABORATORY DATA: Glucose 129, BUN 14, creatinine 1.14, sodium 142, potassium 3, chloride 109, CO2 of 24. White blood cells 7.4, hemoglobin 11.8, hematocrit 34.3, platelets 152. INR 1. Urinalysis is positive for leukocyte esterase and white blood cells is 109.  IMAGING:  The humerus, right, no acute finding. CHEST X-RAY: No acute abnormality. LEFT HIP: Acute left intertrochanteric fracture, possible large left urethral stone. HAND, RIGHT:  No acute finding. Multifocal arthritis.   CT HEAD WITHOUT CONTRAST: No acute intracranial abnormality. Diffuse cerebral atrophy. White matter changes consistent with chronic ischemia. Diffuse cervical spine degenerative changes. No evidence of fracture or dislocation.   CT ABDOMEN AND PELVIS WITHOUT CONTRAST FOR STONES:  Showing no marked change and moderate left hydronephrosis due to large stone at the left ureteropelvic junction.   ASSESSMENT AND PLAN: 1. Fall with left hip fracture. The patient presented with fall; unclear if she has syncope or just fell due to weakness from her UTI. The patient cannot recall exactly. The patient will be admitted to orthopedic floor. ED discussed with orthopedics; most likely, the patient will have surgery in the morning. The patient does not have any chest pain. No shortness of breath. Does not have any EKG changes. We will correct the patient's hypokalemia. We will check magnesium level. We will replace her electrolytes prior to surgery.  2. Urinary tract infection. We will start the patient on Rocephin. We will follow on urine culture. The patient was returned cultures growing enterococcus in November 2014.  3.  Hypokalemia. We will replace. We will check magnesium level.  4.   History of hypothyroidism. We will check TSH. The patient is not taking any medications.  5.  Deep vein thrombosis prophylaxis. SubQ heparin can be started after surgery.   CODE STATUS: Discussed with daughter, who is her healthcare power of attorney. Her name is [name]; reports the patient is DNR.   TOTAL TIME SPENT ON ADMISSION AND PATIENT CARE:  55 minutes.     ____________________________ Starleen Arms, MD dse:dmm D: 05/17/2013 01:04:03 ET T: 05/17/2013 12:39:45 ET JOB#: [id number]  cc: Starleen Arms, MD, <Dictator> DAWOOD Teena Irani MD ELECTRONICALLY SIGNED 05/24/2013 2:00

## 2014-06-25 NOTE — Discharge Summary (Signed)
PATIENT NAME:  [name], Sydni MR#:  [id number] DATE OF BIRTH:  Apr 01, 1922  DATE OF ADMISSION:  05/16/2013 DATE OF DISCHARGE:  05/18/2013  DISPOSITION: Transferred to [facility].  CHIEF COMPLAINT: Fall and a hip fracture.   DISCHARGE DIAGNOSES: 1.  Left hip intertrochanteric fracture.  2.  C1-C2 acute posterior cervical spinal ligamentous injury, of the intraspinous ligaments.  No acute osseous injury. Multilevel degenerative cervical spinal stenosis.  3.  New onset seizures. EEG: No signs of active seizures.   CURRENT MEDICATIONS: Decadron x1 10 mg IV push, levofloxacin 250 mg every 24 hours for UTI, levothyroxine 50 mcg once a day, phenytoin 100 mg every 8 hours, insulin sliding scale, Tylenol as needed, lorazepam as needed.  HOSPITAL COURSE: This is a very nice 81 year old female with history of hypothyroidism and hyperlipidemia who comes after a fall. Apparently, the patient was found on the floor by her daughter, still not very responsive, likely postictal. She was brought to the Emergency Department. In the Emergency Department, the patient had a seizure that was witnessed and started on treatment loading dose of Dilantin then switched to IV Dilantin every 8 hours. The patient had intertrochanteric left side fracture. She had a CT of the head and neck that were overall clear without any masses or acute injuries. The patient was transferred to the floor and set up for neurology consultation. It was done by Dr. Eliseo GumMat Smith who determined that the seizure was likely provoked by the urinary tract infection. She does have a urinary tract infection and has been treated with Levaquin, pending cultures. The patient did not have any more seizure activity after being loaded with Dilantin. EEG was done not showing significant epileptiform activity.  The patient had to be on the schedule for the morning for open reduction internal fixation of the hip, but since she was having significant neck pain a MRI of the  neck was obtained showing C1-C2 acute ligamentous injury. Since the patient had this C1-C2 spinous ligamentous injury, we consulted with orthopedics and a neurologist and since we do not have the capability to have a neurosurgeon or ortho spine, we decided [facility].    Other issues this morning, the patient had a drop in her hemoglobin down to 7 from 11 from the previous one for what 1 unit of blood was transfused. A CT scan of the abdomen was done just to evaluate any acute bleeding and he was negative. It seems like the bleeding is coming from the broken femur. Overall the patient did well during this hospitalization. [facility]   TIME SPENT: About 90 minutes with this patient today arranging transfer, talking with the family and consultants.  ____________________________ Felipa Furnaceoberto Sanchez Gutierrez, MD rsg:sb D: 05/18/2013 13:55:58 ET T: 05/18/2013 16:20:28 ET JOB#: [id number]  cc: Felipa Furnaceoberto Sanchez Gutierrez, MD, <Dictator> Benjamyn Hestand Juanda ChanceSANCHEZ GUTIERRE MD ELECTRONICALLY SIGNED 05/30/2013 13:40

## 2014-06-25 NOTE — Consult Note (Signed)
Brief Consult Note: Diagnosis: Left intertrochanteric comminuted fracture.   Recommend to proceed with surgery or procedure.   Comments: Patient was unavailable for examinatino - was in EEG lab.  Spoke with patients two daughters and granddaughter who were in room waiting for her.  Patient fell due to seizure activity.  She had a second witnessed seizure in the ED.  Walks with a walker in the home only.  Rarely leaves the home and requires max assist to do so.  Spent 30 minutes with family discussing risks and benefits of surgery with family. Discussed post-op functional goals and expected course of recovery and rehab.  Patient is not currently cleared for surgery.  Will plan for IM nail to the left hip to be done tomorrow afternoon assuming medical clearance.  Will return to see patient this afternoon for examination.  Came back to see patient agin this evening - patient now in MRI.  Will check back in morning.  PE: date 05/18/13 Comfortable in bed. In cervical collar per neurology.  TA,GS, EHL intact.  Sensaqtion grossly intact DPN, SPN, tib nerve. DP 1+  Foot warm and well perfused Skin about hip without lesions or disruption.  Plan: For IM nail today if patient is cleared by neurology.  I will be in the OR - medical team will contact neurology consultant.  ADDENDUM: Per neurology consultant, Dr. Katrinka BlazingSmith.  Based on result of C-spine MRI with acute ligamentous injury to C1-C2 after multiple seizures, some weakness that patient and family feel is new, Dr. Katrinka BlazingSmith would like patient transferred for assessment and clearance by spine surgeon which [facility].  Per OR, anesthesia team will be unable to proceed without clearance.  The [facility] and subsequent surgery.  Should there be any change in plan, please re-consult..  Electronic Signatures: Murlean Harkamasunder, Jerianne Anselmo (MD)  (Signed 19-Mar-15 14:51)  Authored: Brief Consult  Note   Last Updated: 19-Mar-15 14:51 by Murlean Harkamasunder, Vasil Juhasz (MD)

## 2014-06-25 NOTE — Consult Note (Signed)
PATIENT NAME:  [name], Wendy MR#:  Fisher DATE OF BIRTH:  [birth year]  DATE OF CONSULTATION:  05/16/2013  CONSULTING PHYSICIAN:  Danelle Earthlyobin T. Eckel, MD  HISTORY OF PRESENT ILLNESS:  [name] is an 81 year old female who is a minimal ambulator with walker assistance, who sustained an unwitnessed fall and was found unconscious on the scene. Once she was aroused, she was complaining of significant left hip pain and inability to bear weight. She was brought to the Emergency Room for further examination.   PAST MEDICAL HISTORY: Significant for frequent recurrent UTIs, kidney stones, as well as osteoarthritis and hypothyroidism.   PAST SURGICAL HISTORY:  Includes cholecystectomy.   CURRENT MEDICATIONS: The patient is unable to give the list of her current medications.   PHYSICAL EXAMINATION: The patient is alert and responsive. She is in a moderate amount of distress secondary to left hip pain. She has a shortened, externally rotated left lower extremity. She has palpable dorsalis pedis and posterior tibial pulses and intact sensation to her left foot. She has no tenderness to palpation of left leg or thigh.  She is also complaining of tenderness to palpation along her right humerus with no obvious bony crepitus. She has no other tenderness to palpation in her left upper or right lower extremities.   RADIOGRAPHS: X-rays taken of the left hip demonstrate an intertrochanteric femur fracture.   ASSESSMENT: An 81 year old female with left intratrochanteric femur fracture.   PLAN: The patient will also obtain x-rays of her right humerus to rule out any humeral shaft fracture or other injury to her right arm. She will also be admitted to the medicine service for medical optimization and risk stratification. Once she is cleared for surgery, the plan will be for cephallomedullary nail fixation of her left femur.   ____________________________ Danelle Earthlyobin T. Eckel, MD tte:dmm D: 05/16/2013 23:40:00  ET T: 05/17/2013 12:33:22 ET JOB#: [id number]  cc: Danelle Earthlyobin T. Eckel, MD, <Dictator> Danelle EarthlyBIN T ECKEL MD ELECTRONICALLY SIGNED 05/17/2013 18:08

## 2014-06-25 NOTE — Discharge Summary (Signed)
PATIENT NAME:  [name], [name] MR#:  [id number] DATE OF BIRTH:  1922-11-06  DATE OF ADMISSION:  05/16/2013 DATE OF DISCHARGE:  05/23/2013  ADDENDUM   Addendum to earlier dictated discharge summary by Dr. Mordecai MaesSanchez  05/18/2013.   FINAL DIAGNOSES: 1. Hip fracture, need surgery. 2. Cervical spine ligament injury on hard cervical collar. Need neuro and spine surgery attention while undergoing hip surgery.   3. Seizure one episode, likely due to urinary tract infection.  4. Urinary tract infection  treated with Levaquin from 03/17.   5. Hypertension.   MEDICATIONS ON DISCHARGE: 1. Morphine 1 mg injection every four hours as needed.  2. Lovenox 30 mg subcutaneous every 12 hours.  3. Amlodipine 5 mg oral once a day.  4. Bisacodyl  10 mg rectal suppository once a day as needed for constipation.  5. Docusate and senna 50 +8.6 mg 2 times a day.   DIET ON DISCHARGE: Low sodium, regular consistency.   TIMEFRAME TO FOLLOW-UP: Within 1 to 2 days. [facility] management of his fracture and cervical spine ligamental injury.  Please follow instructions from there for further discharge plan.   HOSPITAL ADMISSION, HISTORY OF PRESENT ILLNESS AND HOSPITAL COURSE: Please see earlier dictated discharge summary by Dr. Mordecai MaesSanchez on 05/18/2013.    HOSPITAL COURSE AND STAY:  This patient had a hip fracture. She needed surgery for that, but she had cervical spine ligamental injury at C1-C2 level, and so it was high risk surgery in this hospital. [facility] The patient was on waiting list  for [facility] transfer for 5 days. On family request, we also called [facility] as a second choice and they accepted the patient, but due to bed availability the patient was on waiting list over there also. Finally, [facility] had a bed for the patient and we were successful to [facility].   OTHER MEDICAL ISSUES: 1. Acute blood loss anemia. It was stable.  The patient received 2 units of blood transfusion in the hospital.  2. Mild kidney injury due to rhabdo. It improved with hydration.  3. Urinary tract infection. The patient had Levaquin in hospital and she finished six days of therapy.  4. New onset seizure just one episode. neurology consult was called in and they thought most likely it is due to UTI and did not suggested to start on or change any therapy, just continue  Dilantin.   TOTAL TIME SPENT ON THIS DISCHARGE: 40 minutes.   ____________________________ Hope PigeonVaibhavkumar G. Elisabeth PigeonVachhani, MD vgv:sg D: 05/25/2013 16:20:33 ET T: 05/25/2013 18:50:05 ET JOB#: [id number]  cc: Hope PigeonVaibhavkumar G. Elisabeth PigeonVachhani, MD, <Dictator> Altamese DillingVAIBHAVKUMAR Ryleeann Urquiza MD ELECTRONICALLY SIGNED 05/31/2013 21:58

## 2014-06-25 NOTE — H&P (Signed)
PATIENT NAME:  [name], [name] MR#:  [id number] DATE OF BIRTH:  Feb 10, 1923  DATE OF ADMISSION:  05/16/2013  Addendum  This is an addendum to previously dictated history and physical as the patient had significant events requiring an update.  During the patient's stay in the ED, she was noticed to have tonic-clonic seizure in the ED, lasted for a few seconds, resolved without any intervention. The patient remained postictal for around 30 minutes after that. Daughter at bedside reports she noticed her mother at home having a similar mentation when she found her, so most likely the patient's fall was related to new onset seizures. Family denies any history of seizures in the past. The patient will be kept on seizure precautions. She will be loaded with Dilantin. We will obtain EEG in the morning, and we will consult neurology service. I would like to have a neurology evaluation and input prior to proceeding with orthopedic surgery.    ____________________________ Starleen Armsawood S. Onofre Gains, MD dse:dmm D: 05/17/2013 02:10:14 ET T: 05/17/2013 12:59:09 ET JOB#: [id number]  cc: Starleen Armsawood S. Lateria Alderman, MD, <Dictator> Zilah Villaflor Teena IraniS Catalena Stanhope MD ELECTRONICALLY SIGNED 05/24/2013 2:01

## 2017-04-29 ENCOUNTER — Emergency Department
Admission: EM | Admit: 2017-04-29 | Discharge: 2017-04-30 | Disposition: A | Discharge status: Home / Self Care | Payer: Medicare Other | Attending: Emergency Medicine | Admitting: Emergency Medicine

## 2017-04-29 ENCOUNTER — Other Ambulatory Visit: Payer: Self-pay

## 2017-04-29 DIAGNOSIS — Z7982 Long term (current) use of aspirin: Secondary | ICD-10-CM | POA: Insufficient documentation

## 2017-04-29 DIAGNOSIS — Z9049 Acquired absence of other specified parts of digestive tract: Secondary | ICD-10-CM | POA: Diagnosis not present

## 2017-04-29 DIAGNOSIS — R569 Unspecified convulsions: Secondary | ICD-10-CM | POA: Diagnosis present

## 2017-04-29 DIAGNOSIS — N390 Urinary tract infection, site not specified: Secondary | ICD-10-CM | POA: Diagnosis not present

## 2017-04-29 DIAGNOSIS — F0390 Unspecified dementia without behavioral disturbance: Secondary | ICD-10-CM | POA: Diagnosis not present

## 2017-04-29 HISTORY — DX: Blindness, one eye, unspecified eye: H54.40

## 2017-04-29 HISTORY — DX: Unspecified osteoarthritis, unspecified site: M19.90

## 2017-04-29 HISTORY — DX: Unspecified dementia, unspecified severity, without behavioral disturbance, psychotic disturbance, mood disturbance, and anxiety: F03.90

## 2017-04-29 LAB — CBC
HEMATOCRIT: 39.1 % (ref 35.0–47.0)
HEMOGLOBIN: 13.1 g/dL (ref 12.0–16.0)
MCH: 31.3 pg (ref 26.0–34.0)
MCHC: 33.4 g/dL (ref 32.0–36.0)
MCV: 93.5 fL (ref 80.0–100.0)
Platelets: 158 10*3/uL (ref 150–440)
RBC: 4.19 MIL/uL (ref 3.80–5.20)
RDW: 13.6 % (ref 11.5–14.5)
WBC: 5.7 10*3/uL (ref 3.6–11.0)

## 2017-04-29 LAB — HEPATIC FUNCTION PANEL
ALBUMIN: 3.8 g/dL (ref 3.5–5.0)
ALK PHOS: 121 U/L (ref 38–126)
ALT: 12 U/L — ABNORMAL LOW (ref 14–54)
AST: 32 U/L (ref 15–41)
BILIRUBIN DIRECT: 0.2 mg/dL (ref 0.1–0.5)
BILIRUBIN TOTAL: 0.7 mg/dL (ref 0.3–1.2)
Indirect Bilirubin: 0.5 mg/dL (ref 0.3–0.9)
Total Protein: 6.5 g/dL (ref 6.5–8.1)

## 2017-04-29 LAB — TROPONIN I: TROPONIN I: 0.03 ng/mL — AB (ref <0.03)

## 2017-04-29 NOTE — ED Triage Notes (Signed)
Pt arrives [facility] from home for seizure like activity. Pt stiffened up. When FD arrived pt appeared post-ictal, wasn't very responsive. When EMS arrived pt was able to take few steps and was able to follow commands. 02 98% RA, 100 BP palp, CBG 168. Last seizure like activity was d/t UTI. PT was DC from [facility] 1.5 weeks ago for "failure to thrive". Pt has physical therapy, completed today. Saw PCP [facility]Patel [facility]. Pt is cathed twice a day, per EMS usually has bacteria in urine. Pt has dementia, blind in one eye. Arrives with 20 R hand.

## 2017-04-30 ENCOUNTER — Emergency Department: Payer: Medicare Other

## 2017-04-30 LAB — URINALYSIS, ROUTINE W REFLEX MICROSCOPIC
Bilirubin Urine: NEGATIVE
Glucose, UA: NEGATIVE mg/dL
Ketones, ur: 5 mg/dL — AB
NITRITE: NEGATIVE
Protein, ur: NEGATIVE mg/dL
SPECIFIC GRAVITY, URINE: 1.017 (ref 1.005–1.030)
pH: 6 (ref 5.0–8.0)

## 2017-04-30 LAB — LACTIC ACID, PLASMA
LACTIC ACID, VENOUS: 2.3 mmol/L — AB (ref 0.5–1.9)
Lactic Acid, Venous: 1.2 mmol/L (ref 0.5–1.9)

## 2017-04-30 LAB — COMPREHENSIVE METABOLIC PANEL
ALBUMIN: 3.5 g/dL (ref 3.5–5.0)
ALK PHOS: 113 U/L (ref 38–126)
ALT: 14 U/L (ref 14–54)
AST: 31 U/L (ref 15–41)
Anion gap: 8 (ref 5–15)
BUN: 15 mg/dL (ref 6–20)
CALCIUM: 8.7 mg/dL — AB (ref 8.9–10.3)
CO2: 23 mmol/L (ref 22–32)
CREATININE: 0.79 mg/dL (ref 0.44–1.00)
Chloride: 109 mmol/L (ref 101–111)
GFR calc non Af Amer: >60 mL/min (ref >60)
GLUCOSE: 158 mg/dL — AB (ref 65–99)
Potassium: 4 mmol/L (ref 3.5–5.1)
Sodium: 140 mmol/L (ref 135–145)
Total Bilirubin: 0.8 mg/dL (ref 0.3–1.2)
Total Protein: 6.4 g/dL — ABNORMAL LOW (ref 6.5–8.1)

## 2017-04-30 MED ORDER — FOSFOMYCIN TROMETHAMINE 3 G PO PACK
3.0000 g | PACK | Freq: Once | ORAL | Status: AC
  Administered 2017-04-30: 3 g via ORAL
  Filled 2017-04-30: qty 3

## 2017-04-30 MED ORDER — SODIUM CHLORIDE 0.9 % IV BOLUS (SEPSIS): 500.0000 mL | INTRAVENOUS | Status: DC

## 2017-04-30 MED ORDER — SODIUM CHLORIDE 0.9 % IV BOLUS (SEPSIS)
1000.0000 mL | INTRAVENOUS | Status: AC
  Administered 2017-04-30: 1000 mL via INTRAVENOUS

## 2017-04-30 NOTE — Discharge Instructions (Signed)
As we discussed, [name]'s workup was reassuring today.  We do not know what exactly caused her episode, but other than a mild urinary tract infection, we do not have any acute or specific abnormalities.  We offered to admit her to the hospital, but given the absence of a specific issue to address, we agree that going home and following up as an outpatient should be appropriate.  We gave her a one-time dose of antibiotics called fosfomycin which should treat almost all mild urinary tract infections, and a urine culture is pending.  We recommend that you follow-up with her primary care doctor at the next available opportunity.  Return to the emergency department if you develop new or worsening symptoms that concern you.

## 2017-04-30 NOTE — ED Notes (Signed)

## 2017-04-30 NOTE — ED Notes (Signed)
Report off to allison rn  

## 2017-04-30 NOTE — ED Notes (Signed)
Iv fluids infusing  Family with pt. afib on monitor at [id number]

## 2017-04-30 NOTE — ED Provider Notes (Signed)
[facility] Emergency Department Provider Note  ____________________________________________   First MD Initiated Contact with Patient 04/29/17 2304     (approximate)  I have reviewed the triage vital signs and the nursing notes.   HISTORY  Chief Complaint Seizures  Level 5 caveat:  history/ROS limited by chronic dementia  HPI [name] is a 84 y.o. female with chronic medical history as listed below who was discharged from [facility] 1 week ago after admission for "failure to thrive" who presents for evaluation of seizure-like activity.  Her daughter reports that the patient was sitting in her chair and then yelled her screamed.  Her daughter thought maybe she had injured herself but when she looked over the patient was having seizure-like activity with stiff body, shaking all over, and eyes rolled up in the back of her head and unresponsive.  This lasted only a few seconds or possibly a couple of minutes.  Daughter reports that this is happened in the past but not since 2015.  At that time she was told it was because the patient had a urinary tract infection.  She is not on seizure medicine.  She is at her baseline now, awake and alert with chronic dementia.  She has no specific complaints currently.  Her daughter reports that she has been doing well since she left the hospital with no recent respiratory illness and no other acute complaints.  No nausea, no vomiting, no difficulty breathing, no chest pain.  Onset of her chief complaint tonight was acute and severe but relatively brief.  Past Medical History:  Diagnosis Date  . Arthritis   . Blind left eye   . Dementia     There are no active problems to display for this patient.   Past Surgical History:  Procedure Laterality Date  . CHOLECYSTECTOMY      Prior to Admission medications   Medication Sig Start Date End Date Taking? Authorizing Provider  aspirin EC 81 MG tablet Take 81 mg by mouth 3  (three) times a week.    Yes [provider]    Allergies Penicillins and Rocephin [ceftriaxone]  History reviewed. No pertinent family history.  Social History Social History   Tobacco Use  . Smoking status: Never Smoker  . Smokeless tobacco: Current User    Types: Chew  Substance Use Topics  . Alcohol use: No    Frequency: Never  . Drug use: Not on file    Review of Systems Level 5 caveat:  history/ROS limited by chronic dementia ____________________________________________   PHYSICAL EXAM:  VITAL SIGNS: ED Triage Vitals  Enc Vitals Group     BP 04/29/17 2300 (!) 167/75     Pulse Rate 04/29/17 2300 69     Resp 04/29/17 2300 17     Temp 04/29/17 2231 98.6 F (37 C)     Temp Source 04/29/17 2231 Oral     SpO2 04/29/17 2300 99 %     Weight --      Height --      Head Circumference --      Peak Flow --      Pain Score 04/29/17 2234 0     Pain Loc --      Pain Edu? --      Excl. in GC? --     Constitutional: Awake and alert, dementia at baseline, no acute distress Eyes: Chronic left eye blindness.  No abnormalities appreciated on the right side Head: Atraumatic. Nose: No congestion/rhinnorhea.  Mouth/Throat: Mucous membranes are dry. Neck: No stridor.  No meningeal signs.   Cardiovascular: Normal rate, regular rhythm. Good peripheral circulation. Grossly normal heart sounds. Respiratory: Normal respiratory effort.  No retractions. Lungs CTAB. Gastrointestinal: Soft and nontender. No distention.  Musculoskeletal: No lower extremity tenderness nor edema. No gross deformities of extremities. Neurologic:  Normal speech and language. No gross focal neurologic deficits are appreciated.  Skin:  Skin is warm, dry and intact. No rash noted.  ____________________________________________   LABS (all labs ordered are listed, but only abnormal results are displayed)  Labs Reviewed  LACTIC ACID, PLASMA - Abnormal; Notable for the following components:       Result Value   Lactic Acid, Venous 2.3 (*)    All other components within normal limits  HEPATIC FUNCTION PANEL - Abnormal; Notable for the following components:   ALT 12 (*)    All other components within normal limits  TROPONIN I - Abnormal; Notable for the following components:   Troponin I 0.03 (*)    All other components within normal limits  URINALYSIS, ROUTINE W REFLEX MICROSCOPIC - Abnormal; Notable for the following components:   Color, Urine YELLOW (*)    APPearance HAZY (*)    Hgb urine dipstick SMALL (*)    Ketones, ur 5 (*)    Leukocytes, UA SMALL (*)    Bacteria, UA RARE (*)    Squamous Epithelial / LPF 0-5 (*)    All other components within normal limits  COMPREHENSIVE METABOLIC PANEL - Abnormal; Notable for the following components:   Glucose, Bld 158 (*)    Calcium 8.7 (*)    Total Protein 6.4 (*)    All other components within normal limits  URINE CULTURE  CBC  LACTIC ACID, PLASMA  CBG MONITORING, ED   ____________________________________________  EKG  ED ECG REPORT I, Loleta Roseory Attallah Ontko, the attending physician, personally viewed and interpreted this ECG.  Date: 04/29/2017 EKG Time: 22: 35 Rate: 73 Rhythm: normal sinus rhythm w/ occasional PVC QRS Axis: slight LAD Intervals: LAFB ST/T Wave abnormalities: normal Narrative Interpretation: no evidence of acute ischemia  ____________________________________________  RADIOLOGY I, Loleta Roseory Ami Thornsberry, personally viewed and evaluated these images (plain radiographs) as part of my medical decision making, as well as reviewing the written report by the radiologist.  ED MD interpretation:  No acute abnormalities  Official radiology report(s): Ct Head Wo Contrast  Result Date: 04/30/2017 CLINICAL DATA:  Seizure EXAM: CT HEAD WITHOUT CONTRAST TECHNIQUE: Contiguous axial images were obtained from the base of the skull through the vertex without intravenous contrast. COMPARISON:  05/16/2013 FINDINGS: Brain: There is  atrophy and chronic small vessel disease changes. No acute intracranial abnormality. Specifically, no hemorrhage, hydrocephalus, mass lesion, acute infarction, or significant intracranial injury. Vascular: No hyperdense vessel or unexpected calcification. Skull: There are sclerotic areas noted within the calvarium with most prominent sclerotic area in the right frontal region measuring 1.4 cm. These are stable dating back to 2015 and therefore felt represent a benign process. No acute calvarial abnormality. Sinuses/Orbits: Visualized paranasal sinuses and mastoids clear. Orbital soft tissues unremarkable. Other: None IMPRESSION: No acute intracranial abnormality. Atrophy, chronic microvascular disease. Scattered sclerotic areas within the calvarium are stable dating back to 2015 compatible with a benign process. Electronically Signed   By: Charlett NoseKevin  Dover M.D.   On: 04/30/2017 00:32    ____________________________________________   PROCEDURES  Critical Care performed: No   Procedure(s) performed:   Procedures   ____________________________________________   INITIAL IMPRESSION / ASSESSMENT AND  PLAN / ED COURSE  As part of my medical decision making, I reviewed the following data within the electronic MEDICAL RECORD NUMBER History obtained from family, Nursing notes reviewed and incorporated, Labs reviewed , EKG interpreted  and Old chart reviewed    Differential diagnosis includes, but is not limited to, seizure, urinary tract infection, respiratory infection, MI, CVA, acute intracranial bleeding, dehydration/volume depletion, other metabolic derangement.  The patient has essentially normal vital signs except for some hypertension and she is afebrile.  I will evaluate broadly including a head CT and look for any evidence of acute infection.  She reportedly had a similar issue 3 or 4 years ago with a urinary tract infection.  Clinical Course as of Apr 30 408  Wed Apr 30, 2017  0000   0000     0009 Troponin I: (!!) 0.03 [CF]  0017  COMPREHENSIVE METABOLIC PANEL  [AC]  0019 (Final result) LACTIC ACID, PLASMA  [LI]  0021 The patient is noted to have a lactate>2. With the current information available to me, I don't think the patient is in septic shock. The lactate>2, is related to volume depletion and questionable seizure-like activity.  Lactic Acid, Venous: (!!) 2.3 [CF]  0022 CT Head Wo Contrast  [AD]  0022 CT Head Wo Contrast  [AD]  0030   0034 CT Head Wo Contrast  [RI]  0034 (Final result) CT HEAD WO CONTRAST  [RI]  6962 (Final result) COMPREHENSIVE METABOLIC PANEL  [LI]  0057 no leukocytosis WBC: 5.7 [CF]  0114   0117 (Final result) URINALYSIS, ROUTINE W REFLEX MICROSCOPIC  [LI]  0129 I reassessed the patient and she is sleeping in no discomfort.  Her labs are notable for a lactic acid of 2.3 which may be the result of the seizure-like activity that her daughter witnessed.  Her troponin is very slightly elevated at 0.03.  Otherwise her labs are generally reassuring.  She does have some pyuria but it is unclear if this is an active UTI; her daughter mentions that she has been told in the past that the patient always has some bacteria in her urine which is consistent with her age.  I had a long discussion with her daughter and offered admission, but also offer that we could provide IV fluids, a one-time dose of fosfomycin, and reassess the lactic acid after some fluids.  She agrees with that plan.  If she does not require admission then the daughter is comfortable taking her home so we will reassess after some hydration.  [CF]  0130   0258 Teresa Pelton, RN removed as Registered Nurse  [AC]  [phone] (Final result) LACTIC ACID, PLASMA  [LI]  0359 The patient's repeat lactic acid was very reassuring at 1.2.  I do not think that she has sepsis.  Most likely she was a little bit volume depleted and had a lactic acid secondary to the seizure-like activity.  I reiterated everything with the  daughter and she remains comfortable taking the patient home and does not want her admitted at this time which I think is appropriate.  She will follow-up with the primary care provider at the next available opportunity.  I gave my usual and customary return precautions.   [CF]  4132 ED Disposition set to Discharge  [CF]  0408   0408     Clinical Course User Index [AC] Teresa Pelton, RN [AD] Garry Heater, RT [CF] Loleta Rose, MD [LI] Interface, Lab In [facility] [RI] Interface, [place]  Results In [SP] Polite, Shamyka D    ____________________________________________  FINAL CLINICAL IMPRESSION(S) / ED DIAGNOSES  Final diagnoses:  Seizure (HCC)  Urinary tract infection without hematuria, site unspecified     MEDICATIONS GIVEN DURING THIS VISIT:  Medications  fosfomycin (MONUROL) packet 3 g (3 g Oral Given 04/30/17 0202)  sodium chloride 0.9 % bolus 1,000 mL (0 mLs Intravenous Stopped 04/30/17 0315)     ED Discharge Orders    None       Note:  This document was prepared using Dragon voice recognition software and may include unintentional dictation errors.    Loleta Rose, MD 04/30/17 [phone]

## 2017-05-03 LAB — URINE CULTURE: SPECIAL REQUESTS: NORMAL

## 2017-07-02 ENCOUNTER — Emergency Department
Admission: EM | Admit: 2017-07-02 | Discharge: 2017-07-02 | Disposition: A | Discharge status: Home / Self Care | Payer: Medicare Other | Attending: Emergency Medicine | Admitting: Emergency Medicine

## 2017-07-02 ENCOUNTER — Emergency Department: Payer: Medicare Other

## 2017-07-02 ENCOUNTER — Encounter: Payer: Self-pay | Admitting: Emergency Medicine

## 2017-07-02 DIAGNOSIS — S3992XA Unspecified injury of lower back, initial encounter: Secondary | ICD-10-CM | POA: Diagnosis present

## 2017-07-02 DIAGNOSIS — W010XXA Fall on same level from slipping, tripping and stumbling without subsequent striking against object, initial encounter: Secondary | ICD-10-CM | POA: Insufficient documentation

## 2017-07-02 DIAGNOSIS — S32030A Wedge compression fracture of third lumbar vertebra, initial encounter for closed fracture: Secondary | ICD-10-CM | POA: Diagnosis not present

## 2017-07-02 DIAGNOSIS — Y998 Other external cause status: Secondary | ICD-10-CM | POA: Insufficient documentation

## 2017-07-02 DIAGNOSIS — F0390 Unspecified dementia without behavioral disturbance: Secondary | ICD-10-CM | POA: Diagnosis not present

## 2017-07-02 DIAGNOSIS — Y92009 Unspecified place in unspecified non-institutional (private) residence as the place of occurrence of the external cause: Secondary | ICD-10-CM | POA: Insufficient documentation

## 2017-07-02 DIAGNOSIS — W19XXXA Unspecified fall, initial encounter: Secondary | ICD-10-CM

## 2017-07-02 DIAGNOSIS — Y939 Activity, unspecified: Secondary | ICD-10-CM | POA: Diagnosis not present

## 2017-07-02 LAB — BASIC METABOLIC PANEL
Anion gap: 6 (ref 5–15)
BUN: 22 mg/dL — AB (ref 6–20)
CHLORIDE: 106 mmol/L (ref 101–111)
CO2: 26 mmol/L (ref 22–32)
CREATININE: 1.54 mg/dL — AB (ref 0.44–1.00)
Calcium: 8.9 mg/dL (ref 8.9–10.3)
GFR calc Af Amer: 33 mL/min — ABNORMAL LOW (ref >60)
GFR calc non Af Amer: 28 mL/min — ABNORMAL LOW (ref >60)
Glucose, Bld: 92 mg/dL (ref 65–99)
Potassium: 4.3 mmol/L (ref 3.5–5.1)
Sodium: 138 mmol/L (ref 135–145)

## 2017-07-02 LAB — URINALYSIS, COMPLETE (UACMP) WITH MICROSCOPIC
Bilirubin Urine: NEGATIVE
Glucose, UA: NEGATIVE mg/dL
KETONES UR: NEGATIVE mg/dL
Nitrite: NEGATIVE
PROTEIN: NEGATIVE mg/dL
Specific Gravity, Urine: 1.006 (ref 1.005–1.030)
pH: 6 (ref 5.0–8.0)

## 2017-07-02 LAB — CBC WITH DIFFERENTIAL/PLATELET
Basophils Absolute: 0 10*3/uL (ref 0–0.1)
Basophils Relative: 0 %
Eosinophils Absolute: 0.1 10*3/uL (ref 0–0.7)
Eosinophils Relative: 3 %
HCT: 39 % (ref 35.0–47.0)
HEMOGLOBIN: 13.5 g/dL (ref 12.0–16.0)
LYMPHS ABS: 1.7 10*3/uL (ref 1.0–3.6)
LYMPHS PCT: 31 %
MCH: 31.9 pg (ref 26.0–34.0)
MCHC: 34.5 g/dL (ref 32.0–36.0)
MCV: 92.6 fL (ref 80.0–100.0)
MONOS PCT: 5 %
Monocytes Absolute: 0.3 10*3/uL (ref 0.2–0.9)
NEUTROS ABS: 3.4 10*3/uL (ref 1.4–6.5)
NEUTROS PCT: 61 %
Platelets: 142 10*3/uL — ABNORMAL LOW (ref 150–440)
RBC: 4.21 MIL/uL (ref 3.80–5.20)
RDW: 13.8 % (ref 11.5–14.5)
WBC: 5.6 10*3/uL (ref 3.6–11.0)

## 2017-07-02 LAB — TROPONIN I: Troponin I: <0.03 ng/mL (ref <0.03)

## 2017-07-02 NOTE — ED Notes (Signed)
IV removed prior to D/C

## 2017-07-02 NOTE — ED Notes (Signed)
[employer] here to apply LSO brace, family getting education on brace from [employer]

## 2017-07-02 NOTE — ED Notes (Signed)
Pt's daughter signed ED Discharge paperwork and placed on chart

## 2017-07-02 NOTE — ED Triage Notes (Signed)
Patient presents to the ED from home for an unwitnessed fall.  Patient was at home with caretaker when she fell.  Caretaker found patient in supine position.  Patient is complaining of lower back pain.  Patient had recent diagnosis of UTI and EMS states although prescription was filled several days ago there did not appear to be4 any bactrim missing from bottle.  Patient has dementia at baseline.  Patient is oriented to self only.

## 2017-07-02 NOTE — Discharge Instructions (Addendum)
Please seek medical attention for any high fevers, chest pain, shortness of breath, change in behavior, persistent vomiting, bloody stool or any other new or concerning symptoms.  

## 2017-07-02 NOTE — ED Notes (Signed)
[employer] called for [employer] brace per EDP, this RN spoke with [name] who states 2 hour window when they can see pt. Family and pt notified.

## 2017-07-03 NOTE — ED Provider Notes (Signed)
[facility] Emergency Department Provider Note    ____________________________________________   I have reviewed the triage vital signs and the nursing notes.   HISTORY  Chief Complaint Fall   History limited by and level 5 caveat due to: Dementia  HPI [name] is a 84 y.o. female who presents to the emergency department today after spraying some fall. Apparently she was at her house with a caregiver when she fell. The caregiver states that she fell onto her rear end. She denies that the patient hit her head. the patient is complaining of low back pain. The patient is recently being treated for urinary tract infection and family states she has been on antibiotics for this.    Per medical record review patient has a history of dementia.   Past Medical History:  Diagnosis Date  . Arthritis   . Blind left eye   . Dementia     There are no active problems to display for this patient.   Past Surgical History:  Procedure Laterality Date  . CHOLECYSTECTOMY      Prior to Admission medications   Not on File    Allergies Penicillins and Rocephin [ceftriaxone]  No family history on file.  Social History Social History   Tobacco Use  . Smoking status: Never Smoker  . Smokeless tobacco: Current User    Types: Chew  Substance Use Topics  . Alcohol use: No    Frequency: Never  . Drug use: Not on file    Review of Systems Constitutional: No fever/chills Eyes: No visual changes. ENT: No sore throat. Cardiovascular: Denies chest pain. Respiratory: Denies shortness of breath. Gastrointestinal: No abdominal pain.  No nausea, no vomiting.  No diarrhea.   Genitourinary: Positive for recent urinary tract infection Musculoskeletal: Positive for low back pain. Skin: Negative for rash. Neurological: Negative for headaches, focal weakness or numbness.  ____________________________________________   PHYSICAL EXAM:  VITAL SIGNS: ED Triage  Vitals  Enc Vitals Group     BP 07/02/17 1608 (!) 163/64     Pulse Rate 07/02/17 1608 71     Resp 07/02/17 1608 17     Temp --      Temp src --      SpO2 07/02/17 1608 96 %     Weight 07/02/17 1332 135 lb (61.2 kg)     Height 07/02/17 1332  (1.575 m)     Head Circumference --      Peak Flow --      Pain Score 07/02/17 1721 0   Constitutional: Alert and oriented. Well appearing and in no distress. Eyes: Conjunctivae are normal.  ENT   Head: Normocephalic and atraumatic.   Nose: No congestion/rhinnorhea.   Mouth/Throat: Mucous membranes are moist.   Neck: No stridor. Hematological/Lymphatic/Immunilogical: No cervical lymphadenopathy. Cardiovascular: Normal rate, regular rhythm.  No murmurs, rubs, or gallops.  Respiratory: Normal respiratory effort without tachypnea nor retractions. Breath sounds are clear and equal bilaterally. No wheezes/rales/rhonchi. Gastrointestinal: Soft and non tender. No rebound. No guarding.  Genitourinary: Deferred Musculoskeletal: Normal range of motion in all extremities. No lower extremity edema. Neurologic:  Demented.  No gross focal neurologic deficits are appreciated.  Skin:  Skin is warm, dry and intact. No rash noted. Psychiatric: Mood and affect are normal. Speech and behavior are normal. Patient exhibits appropriate insight and judgment.  ____________________________________________    LABS (pertinent [facility], wbc 11-20 CBC wnl except plt 142 BMP k 4.3, na 139, cr 1.54  Trop <0.03 ____________________________________________   EKG  None  ____________________________________________    RADIOLOGY  Lumbar spine Age indeterminate L3 L4 compression fracture  ____________________________________________   PROCEDURES  Procedures  ____________________________________________   INITIAL IMPRESSION / ASSESSMENT AND PLAN / ED COURSE  Pertinent labs & imaging results that were  available during my care of the patient were reviewed by me and considered in my medical decision making (see chart for details).  patient presented to the emergency department today after a fall and complains of low back pain. Urine is consistent with urinary tract infection family states she is on antibiotics. Lumbar spine x-ray was concerning for lumbar fractures. She was placed in an LSO brace. She was able to move around with less pain. This point will discharge with LSO brace.   ____________________________________________   FINAL CLINICAL IMPRESSION(S) / ED DIAGNOSES  Final diagnoses:  Fall, initial encounter  Closed compression fracture of third lumbar vertebra, initial encounter [facility])     Note: This dictation was prepared with Dragon dictation. Any transcriptional errors that result from this process are unintentional     Phineas Semen, MD 07/03/17 1700

## 2018-05-16 ENCOUNTER — Other Ambulatory Visit: Payer: Self-pay

## 2018-05-16 ENCOUNTER — Encounter: Payer: Self-pay | Admitting: Emergency Medicine

## 2018-05-16 DIAGNOSIS — N390 Urinary tract infection, site not specified: Secondary | ICD-10-CM | POA: Insufficient documentation

## 2018-05-16 DIAGNOSIS — F0390 Unspecified dementia without behavioral disturbance: Secondary | ICD-10-CM | POA: Diagnosis not present

## 2018-05-16 DIAGNOSIS — Z79899 Other long term (current) drug therapy: Secondary | ICD-10-CM | POA: Diagnosis not present

## 2018-05-16 DIAGNOSIS — Z87891 Personal history of nicotine dependence: Secondary | ICD-10-CM | POA: Diagnosis not present

## 2018-05-16 DIAGNOSIS — R339 Retention of urine, unspecified: Secondary | ICD-10-CM | POA: Diagnosis present

## 2018-05-16 NOTE — ED Triage Notes (Signed)
Pt's daughter says she cath's her mom twice daily for several years now; says this am her urine was clear; tonight there was a lot of mucus in her depends when she went to cath her; strong odor; denies fevers at home; pt awake; oriented to person only;

## 2018-05-17 ENCOUNTER — Emergency Department
Admission: EM | Admit: 2018-05-17 | Discharge: 2018-05-17 | Disposition: A | Discharge status: Home / Self Care | Payer: Medicare Other | Attending: Emergency Medicine | Admitting: Emergency Medicine

## 2018-05-17 DIAGNOSIS — N390 Urinary tract infection, site not specified: Secondary | ICD-10-CM

## 2018-05-17 LAB — URINALYSIS, MICROSCOPIC (REFLEX)
RBC / HPF: UNDETERMINED RBC/hpf (ref 0–5)
Squamous Epithelial / HPF: UNDETERMINED (ref 0–5)
WBC, UA: >50 WBC/hpf (ref 0–5)

## 2018-05-17 LAB — CBC
HCT: 37.7 % (ref 36.0–46.0)
HEMOGLOBIN: 12.6 g/dL (ref 12.0–15.0)
MCH: 32.1 pg (ref 26.0–34.0)
MCHC: 33.4 g/dL (ref 30.0–36.0)
MCV: 95.9 fL (ref 80.0–100.0)
Platelets: 161 10*3/uL (ref 150–400)
RBC: 3.93 MIL/uL (ref 3.87–5.11)
RDW: 13.1 % (ref 11.5–15.5)
WBC: 6 10*3/uL (ref 4.0–10.5)
nRBC: 0 % (ref 0.0–0.2)

## 2018-05-17 LAB — BASIC METABOLIC PANEL
Anion gap: 7 (ref 5–15)
BUN: 25 mg/dL — ABNORMAL HIGH (ref 8–23)
CHLORIDE: 109 mmol/L (ref 98–111)
CO2: 25 mmol/L (ref 22–32)
Calcium: 8.7 mg/dL — ABNORMAL LOW (ref 8.9–10.3)
Creatinine, Ser: 0.71 mg/dL (ref 0.44–1.00)
GFR calc Af Amer: >60 mL/min (ref >60)
GFR calc non Af Amer: >60 mL/min (ref >60)
Glucose, Bld: 108 mg/dL — ABNORMAL HIGH (ref 70–99)
Potassium: 3.7 mmol/L (ref 3.5–5.1)
Sodium: 141 mmol/L (ref 135–145)

## 2018-05-17 MED ORDER — CIPROFLOXACIN IN D5W 400 MG/200ML IV SOLN
400.0000 mg | INTRAVENOUS | Status: AC
  Administered 2018-05-17: 400 mg via INTRAVENOUS
  Filled 2018-05-17: qty 200

## 2018-05-17 MED ORDER — CIPROFLOXACIN HCL 500 MG PO TABS: 500.0000 mg | ORAL_TABLET | Freq: Two times a day (BID) | ORAL | 0 refills | Status: DC

## 2018-05-17 NOTE — ED Provider Notes (Signed)
[facility] Emergency Department Provider Note  ___________________________________________   First MD Initiated Contact with Patient 05/17/18 0258     (approximate)  I have reviewed the triage vital signs and the nursing notes.   HISTORY  Chief Complaint Urinary Retention  EM caveat: The patient herself has dementia, advanced per the daughter and cannot really provide history the daughter however is very involved in her care and provides the history  HPI [name] is a 85 y.o. female here for evaluation with her daughter who reports also her healthcare power of attorney  Her mother is not any fevers or been in any pain.  She normally catheterizes her twice daily for urine due to history of retention.  This is been something she is done for 5 years.  Over the last couple of days, she noticed that her mother has had some slight decrease in urine production 1 night but then she drank much tea and had an episode of incontinence which does happen from time to time.  Today however when she catheterized her this morning, she noticed that there was a pussy or white thick mucousy look to the urine obtained.  She has not seen a rash.  The patient does not complain of any discomfort except she did complain of a little bit of burning earlier today.  She continues to eat and drink normally, generally does not drink too much but likes to drink sweet tea which she has been doing normally.  She does have a history of some urinary tract infections in the past, and they had somewhat similar presentation except the daughter reports never this thick of a mucousy appearance.  There has also been an abnormal odor with the urine she is collected.  Her mother is not been any pain, she has been acting normally which is confused with dementia baseline but otherwise behaving the same with normal mobility.    Past Medical History:  Diagnosis Date  . Arthritis   . Blind left eye   .  Dementia (HCC)     There are no active problems to display for this patient.   Past Surgical History:  Procedure Laterality Date  . CHOLECYSTECTOMY      Prior to Admission medications   Medication Sig Start Date End Date Taking? Authorizing Provider  naproxen (NAPROSYN) 250 MG tablet Take by mouth 2 (two) times daily with a meal.   Yes [provider]  ciprofloxacin (CIPRO) 500 MG tablet Take 1 tablet (500 mg total) by mouth 2 (two) times daily. 05/17/18   Sharyn Creamer, MD    Allergies Penicillins and Rocephin [ceftriaxone]  History reviewed. No pertinent family history.  Social History Social History   Tobacco Use  . Smoking status: Never Smoker  . Smokeless tobacco: Former Neurosurgeon    Types: Snuff  Substance Use Topics  . Alcohol use: No    Frequency: Never  . Drug use: Never    Review of Systems Constitutional: No fever/chills Eyes: No visual changes.  Blindness Cardiovascular: Denies chest pain. Respiratory: Denies shortness of breath.  No cough. Gastrointestinal: No abdominal pain.  Does not complain of any pain or discomfort continues to eat and drink normally with normal bowel movements. Genitourinary: See HPI musculoskeletal: Negative for back pain. Skin: Negative for rash.  There has not noticed any rash in the groin sites or white or thick rash around the legs. Neurological: Negative for headaches or changes in her mental status or mobility.  ____________________________________________   PHYSICAL EXAM:  VITAL SIGNS: ED Triage Vitals  Enc Vitals Group     BP --      Pulse Rate 05/16/18 2346 62     Resp --      Temp 05/16/18 2346 97.9 F (36.6 C)     Temp Source 05/16/18 2346 Oral     SpO2 05/16/18 2346 98 %     Weight 05/16/18 2347 135 lb (61.2 kg)     Height 05/16/18 2347 5\' 2"  (1.575 m)     Head Circumference --      Peak Flow --      Pain Score --      Pain Loc --      Pain Edu? --      Excl. in GC? --     Constitutional: Alert  and disoriented to year and date.  She is calm and very pleasant does not show any evidence of psychomotor agitation.  Seems to recognize her daughter well is very pleasant and attentive.  Overall the patient appears very well cared for. Eyes: Conjunctivae are normal. Head: Atraumatic. Nose: No congestion/rhinnorhea. Mouth/Throat: Mucous membranes are moist. Neck: No stridor.  Cardiovascular: Normal rate, regular rhythm. Grossly normal heart sounds.  Good peripheral circulation. Respiratory: Normal respiratory effort.  No retractions. Lungs CTAB. Gastrointestinal: Soft and nontender. No distention.  Intertriginous regions of the groin do not show rash, no evidence of candidiasis. Musculoskeletal: No lower extremity tenderness nor edema. Neurologic:  No gross focal neurologic deficits are appreciated.  Skin:  Skin is warm, dry and intact. No rash noted. Psychiatric: Mood and affect are calm, somewhat flat.  ____________________________________________   LABS (all labs ordered are listed, but only abnormal results are displayed)  Labs Reviewed  BASIC METABOLIC PANEL - Abnormal; Notable for the following components:      Result Value   Glucose, Bld 108 (*)    BUN 25 (*)    Calcium 8.7 (*)    All other components within normal limits  URINALYSIS, MICROSCOPIC (REFLEX) - Abnormal; Notable for the following components:   Bacteria, UA MANY (*)    All other components within normal limits  URINE CULTURE  CBC   ____________________________________________  EKG   ____________________________________________  RADIOLOGY   ____________________________________________   PROCEDURES  Procedure(s) performed: None  Procedures  Critical Care performed: No  ____________________________________________   INITIAL IMPRESSION / ASSESSMENT AND PLAN / ED COURSE  Pertinent labs & imaging results that were available during my care of the patient were reviewed by me and considered in my  medical decision making (see chart for details).   Patient presents for purulence noted in her urine.  Continues to urinate, but occasionally having some incontinence and having twice daily catheterizations for many years.  She appears well, appears euvolemic, in no distress without abdominal pain on exam.  Her lab work including renal function is quite reassuring.  She shows her offers no signs or symptoms to suggest sepsis, renal insufficiency, dehydration, or acute complication.  She does have some purulence noted in her urine, and urinalysis confirms multiple bacteria.  Clinical Course as of May 17 426  Sun May 17, 2018  0401 Reviewed prior culture, antibiotic options with pharmacist. Not a canditate for PCN or rocephin and last culuture resistant to bacterim and some pcns. Unforunately, has purulent cystitis. Cipro 500mg  BID for 5 days recommended by pharmacy Onalee Hua). I agree given the patient's history this appears the good choice.    [MQ]  80 Ph   [MQ]    Clinical Course User Index [MQ] Sharyn Creamer, MD   ----------------------------------------- 4:27 AM on 05/17/2018 -----------------------------------------  Antibiotic choice discussed with family, she does have a known allergy to both penicillins and cephalosporins including Rocephin according to her daughter reports she cannot have penicillin or "Rocephin" because of severe reactions.  Her last urine culture shows resistance to Bactrim.  Discussed with his daughter, and we also discussed complications around Cipro given her age, joint injury, tendon rupture etc.  And daughter acknowledges these risks and is agreeable with ciprofloxacin and reports the patient has used Cipro for urinary tract infections in the past with good effect.  Urine culture sent, treating with Cipro.  I discussed very careful return precautions with the daughter who is in agreement. Return precautions and treatment recommendations and follow-up discussed with  the patient's daughter Avon Gully per daughter) who is agreeable with the plan.   ____________________________________________   FINAL CLINICAL IMPRESSION(S) / ED DIAGNOSES  Final diagnoses:  Lower urinary tract infection, acute        Note:  This document was prepared using Dragon voice recognition software and may include unintentional dictation errors       Sharyn Creamer, MD 05/17/18 [phone]

## 2018-05-17 NOTE — Discharge Instructions (Addendum)
You have been seen in the Emergency Department (ED) today for a possible urinary tract infection.  Your workup today suggests that you have a urinary tract infection (UTI).  Please take your antibiotic as prescribed. Continue to drink PLENTY of fluids.  Call your regular doctor to schedule the next available appointment to follow up on todays ED visit, or return immediately to the ED if your pain worsens, patient becomes confused, develops a fever, can't urinate, you have decreased urine production, develop fever, persistent vomiting, or other symptoms that concern you.

## 2018-05-17 NOTE — ED Notes (Signed)
Pt states unable to tolerate VS at this time.

## 2018-05-17 NOTE — ED Notes (Signed)
In and out cath done by this tech. Large amount of mucus present.

## 2018-05-18 LAB — URINE CULTURE: SPECIAL REQUESTS: NORMAL

## 2018-08-01 ENCOUNTER — Other Ambulatory Visit: Payer: Self-pay

## 2018-08-01 DIAGNOSIS — J918 Pleural effusion in other conditions classified elsewhere: Secondary | ICD-10-CM | POA: Diagnosis present

## 2018-08-01 DIAGNOSIS — H548 Legal blindness, as defined in USA: Secondary | ICD-10-CM | POA: Diagnosis present

## 2018-08-01 DIAGNOSIS — R531 Weakness: Secondary | ICD-10-CM | POA: Diagnosis not present

## 2018-08-01 DIAGNOSIS — Z9049 Acquired absence of other specified parts of digestive tract: Secondary | ICD-10-CM

## 2018-08-01 DIAGNOSIS — Z791 Long term (current) use of non-steroidal anti-inflammatories (NSAID): Secondary | ICD-10-CM

## 2018-08-01 DIAGNOSIS — Z20828 Contact with and (suspected) exposure to other viral communicable diseases: Secondary | ICD-10-CM | POA: Diagnosis present

## 2018-08-01 DIAGNOSIS — A419 Sepsis, unspecified organism: Secondary | ICD-10-CM | POA: Diagnosis not present

## 2018-08-01 DIAGNOSIS — Z66 Do not resuscitate: Secondary | ICD-10-CM | POA: Diagnosis present

## 2018-08-01 DIAGNOSIS — Z881 Allergy status to other antibiotic agents status: Secondary | ICD-10-CM

## 2018-08-01 DIAGNOSIS — F0390 Unspecified dementia without behavioral disturbance: Secondary | ICD-10-CM | POA: Diagnosis present

## 2018-08-01 DIAGNOSIS — N390 Urinary tract infection, site not specified: Secondary | ICD-10-CM | POA: Diagnosis present

## 2018-08-01 DIAGNOSIS — G9341 Metabolic encephalopathy: Secondary | ICD-10-CM | POA: Diagnosis present

## 2018-08-01 DIAGNOSIS — Z87440 Personal history of urinary (tract) infections: Secondary | ICD-10-CM

## 2018-08-01 DIAGNOSIS — Z880 Allergy status to penicillin: Secondary | ICD-10-CM

## 2018-08-01 DIAGNOSIS — E876 Hypokalemia: Secondary | ICD-10-CM | POA: Diagnosis present

## 2018-08-01 DIAGNOSIS — Z87891 Personal history of nicotine dependence: Secondary | ICD-10-CM

## 2018-08-01 DIAGNOSIS — J189 Pneumonia, unspecified organism: Secondary | ICD-10-CM | POA: Diagnosis present

## 2018-08-01 DIAGNOSIS — M1990 Unspecified osteoarthritis, unspecified site: Secondary | ICD-10-CM | POA: Diagnosis present

## 2018-08-01 LAB — COMPREHENSIVE METABOLIC PANEL WITH GFR
ALT: 12 U/L (ref 0–44)
AST: 24 U/L (ref 15–41)
Albumin: 3.7 g/dL (ref 3.5–5.0)
Alkaline Phosphatase: 117 U/L (ref 38–126)
Anion gap: 10 (ref 5–15)
BUN: 21 mg/dL (ref 8–23)
CO2: 21 mmol/L — ABNORMAL LOW (ref 22–32)
Calcium: 8.6 mg/dL — ABNORMAL LOW (ref 8.9–10.3)
Chloride: 109 mmol/L (ref 98–111)
Creatinine, Ser: 0.86 mg/dL (ref 0.44–1.00)
GFR calc Af Amer: >60 mL/min
GFR calc non Af Amer: 58 mL/min — ABNORMAL LOW
Glucose, Bld: 159 mg/dL — ABNORMAL HIGH (ref 70–99)
Potassium: 3.5 mmol/L (ref 3.5–5.1)
Sodium: 140 mmol/L (ref 135–145)
Total Bilirubin: 0.9 mg/dL (ref 0.3–1.2)
Total Protein: 6.4 g/dL — ABNORMAL LOW (ref 6.5–8.1)

## 2018-08-01 LAB — CBC WITH DIFFERENTIAL/PLATELET
Abs Immature Granulocytes: 0.03 K/uL (ref 0.00–0.07)
Basophils Absolute: 0 K/uL (ref 0.0–0.1)
Basophils Relative: 0 %
Eosinophils Absolute: 0.1 K/uL (ref 0.0–0.5)
Eosinophils Relative: 1 %
HCT: 39.6 % (ref 36.0–46.0)
Hemoglobin: 13.1 g/dL (ref 12.0–15.0)
Immature Granulocytes: 0 %
Lymphocytes Relative: 15 %
Lymphs Abs: 1.5 K/uL (ref 0.7–4.0)
MCH: 31.3 pg (ref 26.0–34.0)
MCHC: 33.1 g/dL (ref 30.0–36.0)
MCV: 94.5 fL (ref 80.0–100.0)
Monocytes Absolute: 0.3 K/uL (ref 0.1–1.0)
Monocytes Relative: 3 %
Neutro Abs: 8 K/uL — ABNORMAL HIGH (ref 1.7–7.7)
Neutrophils Relative %: 81 %
Platelets: 153 K/uL (ref 150–400)
RBC: 4.19 MIL/uL (ref 3.87–5.11)
RDW: 13.2 % (ref 11.5–15.5)
WBC: 9.9 K/uL (ref 4.0–10.5)
nRBC: 0 % (ref 0.0–0.2)

## 2018-08-01 NOTE — ED Triage Notes (Signed)
Family reports patient had fever at home tonight of 65 and has been complaining of a fever.  Patient was given 324 mg of Aspirin.  Family reports patient is more disoriented than usual.

## 2018-08-02 ENCOUNTER — Other Ambulatory Visit: Payer: Self-pay

## 2018-08-02 ENCOUNTER — Inpatient Hospital Stay
Admission: EM | Admit: 2018-08-02 | Discharge: 2018-08-04 | DRG: 871 | Disposition: A | Discharge status: Home / Self Care | Payer: Medicare Other | Attending: Internal Medicine | Admitting: Internal Medicine

## 2018-08-02 ENCOUNTER — Emergency Department: Payer: Medicare Other

## 2018-08-02 DIAGNOSIS — R531 Weakness: Secondary | ICD-10-CM

## 2018-08-02 DIAGNOSIS — Z20828 Contact with and (suspected) exposure to other viral communicable diseases: Secondary | ICD-10-CM | POA: Diagnosis present

## 2018-08-02 DIAGNOSIS — J918 Pleural effusion in other conditions classified elsewhere: Secondary | ICD-10-CM | POA: Diagnosis present

## 2018-08-02 DIAGNOSIS — F0390 Unspecified dementia without behavioral disturbance: Secondary | ICD-10-CM | POA: Diagnosis present

## 2018-08-02 DIAGNOSIS — A419 Sepsis, unspecified organism: Secondary | ICD-10-CM | POA: Diagnosis present

## 2018-08-02 DIAGNOSIS — Z87891 Personal history of nicotine dependence: Secondary | ICD-10-CM | POA: Diagnosis not present

## 2018-08-02 DIAGNOSIS — M1990 Unspecified osteoarthritis, unspecified site: Secondary | ICD-10-CM | POA: Diagnosis present

## 2018-08-02 DIAGNOSIS — H548 Legal blindness, as defined in USA: Secondary | ICD-10-CM | POA: Diagnosis present

## 2018-08-02 DIAGNOSIS — Z881 Allergy status to other antibiotic agents status: Secondary | ICD-10-CM | POA: Diagnosis not present

## 2018-08-02 DIAGNOSIS — Z66 Do not resuscitate: Secondary | ICD-10-CM | POA: Diagnosis present

## 2018-08-02 DIAGNOSIS — Z9049 Acquired absence of other specified parts of digestive tract: Secondary | ICD-10-CM | POA: Diagnosis not present

## 2018-08-02 DIAGNOSIS — Z791 Long term (current) use of non-steroidal anti-inflammatories (NSAID): Secondary | ICD-10-CM | POA: Diagnosis not present

## 2018-08-02 DIAGNOSIS — G9341 Metabolic encephalopathy: Secondary | ICD-10-CM | POA: Diagnosis present

## 2018-08-02 DIAGNOSIS — E876 Hypokalemia: Secondary | ICD-10-CM | POA: Diagnosis present

## 2018-08-02 DIAGNOSIS — Z880 Allergy status to penicillin: Secondary | ICD-10-CM | POA: Diagnosis not present

## 2018-08-02 DIAGNOSIS — J189 Pneumonia, unspecified organism: Secondary | ICD-10-CM | POA: Diagnosis present

## 2018-08-02 DIAGNOSIS — N390 Urinary tract infection, site not specified: Secondary | ICD-10-CM | POA: Diagnosis present

## 2018-08-02 DIAGNOSIS — Z87440 Personal history of urinary (tract) infections: Secondary | ICD-10-CM | POA: Diagnosis not present

## 2018-08-02 LAB — URINALYSIS, COMPLETE (UACMP) WITH MICROSCOPIC
Bilirubin Urine: NEGATIVE
Glucose, UA: NEGATIVE mg/dL
Ketones, ur: NEGATIVE mg/dL
Nitrite: POSITIVE — AB
Protein, ur: 30 mg/dL — AB
Specific Gravity, Urine: 1.018 (ref 1.005–1.030)
WBC, UA: >50 WBC/hpf — ABNORMAL HIGH (ref 0–5)
pH: 5 (ref 5.0–8.0)

## 2018-08-02 LAB — CBC
HCT: 34.1 % — ABNORMAL LOW (ref 36.0–46.0)
Hemoglobin: 11.2 g/dL — ABNORMAL LOW (ref 12.0–15.0)
MCH: 31.5 pg (ref 26.0–34.0)
MCHC: 32.8 g/dL (ref 30.0–36.0)
MCV: 95.8 fL (ref 80.0–100.0)
Platelets: 120 10*3/uL — ABNORMAL LOW (ref 150–400)
RBC: 3.56 MIL/uL — ABNORMAL LOW (ref 3.87–5.11)
RDW: 13.3 % (ref 11.5–15.5)
WBC: 6.3 10*3/uL (ref 4.0–10.5)
nRBC: 0 % (ref 0.0–0.2)

## 2018-08-02 LAB — CREATININE, SERUM
Creatinine, Ser: 0.76 mg/dL (ref 0.44–1.00)
GFR calc Af Amer: >60 mL/min (ref >60)
GFR calc non Af Amer: >60 mL/min (ref >60)

## 2018-08-02 LAB — TROPONIN I: Troponin I: <0.03 ng/mL (ref <0.03)

## 2018-08-02 LAB — LACTIC ACID, PLASMA
Lactic Acid, Venous: 2.2 mmol/L (ref 0.5–1.9)
Lactic Acid, Venous: 1.1 mmol/L (ref 0.5–1.9)

## 2018-08-02 LAB — SARS CORONAVIRUS 2 BY RT PCR (HOSPITAL ORDER, PERFORMED IN ~~LOC~~ HOSPITAL LAB): SARS Coronavirus 2: NEGATIVE

## 2018-08-02 LAB — MRSA PCR SCREENING: MRSA by PCR: POSITIVE — AB

## 2018-08-02 MED ORDER — ACETAMINOPHEN 650 MG RE SUPP: 650.0000 mg | Freq: Four times a day (QID) | RECTAL | Status: DC | PRN

## 2018-08-02 MED ORDER — NAPROXEN 250 MG PO TABS
250.0000 mg | ORAL_TABLET | Freq: Two times a day (BID) | ORAL | Status: DC
  Filled 2018-08-02 (×4): qty 1

## 2018-08-02 MED ORDER — SODIUM CHLORIDE 0.9 % IV BOLUS (SEPSIS)
1000.0000 mL | Freq: Once | INTRAVENOUS | Status: AC
  Administered 2018-08-02: 1000 mL via INTRAVENOUS

## 2018-08-02 MED ORDER — FUROSEMIDE 10 MG/ML IJ SOLN
20.0000 mg | Freq: Once | INTRAMUSCULAR | Status: AC
  Administered 2018-08-02: 10:00:00 20 mg via INTRAVENOUS
  Filled 2018-08-02: qty 4

## 2018-08-02 MED ORDER — DOCUSATE SODIUM 100 MG PO CAPS: 100.0000 mg | ORAL_CAPSULE | Freq: Two times a day (BID) | ORAL | Status: DC | PRN

## 2018-08-02 MED ORDER — MUPIROCIN 2 % EX OINT
1.0000 "application " | TOPICAL_OINTMENT | Freq: Two times a day (BID) | CUTANEOUS | Status: DC
  Administered 2018-08-02 – 2018-08-03 (×3): 1 via NASAL
  Filled 2018-08-02: qty 22

## 2018-08-02 MED ORDER — SODIUM CHLORIDE 0.9 % IV SOLN
2.0000 g | Freq: Once | INTRAVENOUS | Status: AC
  Administered 2018-08-02: 05:00:00 2 g via INTRAVENOUS
  Filled 2018-08-02: qty 2

## 2018-08-02 MED ORDER — METRONIDAZOLE IN NACL 5-0.79 MG/ML-% IV SOLN
500.0000 mg | Freq: Once | INTRAVENOUS | Status: AC
  Administered 2018-08-02: 05:00:00 500 mg via INTRAVENOUS
  Filled 2018-08-02: qty 100

## 2018-08-02 MED ORDER — ACETAMINOPHEN 325 MG PO TABS: 650.0000 mg | ORAL_TABLET | Freq: Four times a day (QID) | ORAL | Status: DC | PRN

## 2018-08-02 MED ORDER — ENOXAPARIN SODIUM 40 MG/0.4ML ~~LOC~~ SOLN
40.0000 mg | SUBCUTANEOUS | Status: DC
  Administered 2018-08-02 – 2018-08-03 (×2): 40 mg via SUBCUTANEOUS
  Filled 2018-08-02 (×2): qty 0.4

## 2018-08-02 MED ORDER — ONDANSETRON HCL 4 MG PO TABS: 4.0000 mg | ORAL_TABLET | Freq: Four times a day (QID) | ORAL | Status: DC | PRN

## 2018-08-02 MED ORDER — VANCOMYCIN HCL IN DEXTROSE 750-5 MG/150ML-% IV SOLN
750.0000 mg | INTRAVENOUS | Status: DC
  Administered 2018-08-03 – 2018-08-04 (×2): 750 mg via INTRAVENOUS
  Filled 2018-08-02 (×2): qty 150

## 2018-08-02 MED ORDER — SODIUM CHLORIDE 0.9 % IV SOLN
2.0000 g | Freq: Three times a day (TID) | INTRAVENOUS | Status: DC
  Administered 2018-08-02 – 2018-08-04 (×6): 2 g via INTRAVENOUS
  Filled 2018-08-02 (×8): qty 2

## 2018-08-02 MED ORDER — SODIUM CHLORIDE 0.9 % IV SOLN
INTRAVENOUS | Status: DC | PRN
  Administered 2018-08-02 – 2018-08-03 (×2): 250 mL via INTRAVENOUS

## 2018-08-02 MED ORDER — VANCOMYCIN HCL IN DEXTROSE 1-5 GM/200ML-% IV SOLN
1000.0000 mg | Freq: Once | INTRAVENOUS | Status: AC
  Administered 2018-08-02: 07:00:00 1000 mg via INTRAVENOUS
  Filled 2018-08-02: qty 200

## 2018-08-02 MED ORDER — CHLORHEXIDINE GLUCONATE CLOTH 2 % EX PADS
6.0000 | MEDICATED_PAD | Freq: Every day | CUTANEOUS | Status: DC
  Administered 2018-08-03 – 2018-08-04 (×2): 6 via TOPICAL

## 2018-08-02 MED ORDER — ONDANSETRON HCL 4 MG/2ML IJ SOLN: 4.0000 mg | Freq: Four times a day (QID) | INTRAMUSCULAR | Status: DC | PRN

## 2018-08-02 NOTE — Progress Notes (Signed)
CODE SEPSIS - PHARMACY COMMUNICATION  **Broad Spectrum Antibiotics should be administered within 1 hour of Sepsis diagnosis**  Time Code Sepsis Called/Page Received: @ 0325  Antibiotics Ordered: Aztreonam, vancomycin   Time of 1st antibiotic administration: @ 0435  Additional action taken by pharmacy: Spoke with RN @ [phone] about time left to give abx for code sepsis   Gardner Candle, PharmD, BCPS Clinical Pharmacist 08/02/2018 4:48 AM

## 2018-08-02 NOTE — ED Provider Notes (Signed)
[facility] Emergency Department Provider Note   ____________________________________________   First MD Initiated Contact with Patient 08/02/18 [phone]     (approximate)  I have reviewed the triage vital signs and the nursing notes.   HISTORY  Chief Complaint Fever    HPI [name] is a 85 y.o. female brought to the ED from home by her daughter with a chief complaint of fever and generalized weakness.  Daughter reports T-max 5 F and patient weaker than usual.  History of UTIs.  Daughter has to in and out cath the patient secondary to bladder prolapse.  Daughter denies cough, chest pain, shortness of breath, abdominal pain, vomiting or diarrhea.  Denies recent travel, trauma or exposure to persons diagnosed with coronavirus.       Past Medical History:  Diagnosis Date   Arthritis    Blind left eye    Dementia (HCC)     There are no active problems to display for this patient.   Past Surgical History:  Procedure Laterality Date   CHOLECYSTECTOMY      Prior to Admission medications   Medication Sig Start Date End Date Taking? Authorizing Provider  ciprofloxacin (CIPRO) 500 MG tablet Take 1 tablet (500 mg total) by mouth 2 (two) times daily. 05/17/18   Sharyn Creamer, MD  naproxen (NAPROSYN) 250 MG tablet Take by mouth 2 (two) times daily with a meal.    [provider]    Allergies Penicillins and Rocephin [ceftriaxone]  No family history on file.  Social History Social History   Tobacco Use   Smoking status: Never Smoker   Smokeless tobacco: Former Neurosurgeon    Types: Snuff  Substance Use Topics   Alcohol use: No    Frequency: Never   Drug use: Never    Review of Systems  Constitutional: Positive for fever and generalized weakness. Eyes: No visual changes. ENT: No sore throat. Cardiovascular: Denies chest pain. Respiratory: Denies shortness of breath. Gastrointestinal: No abdominal pain.  No nausea, no  vomiting.  No diarrhea.  No constipation. Genitourinary: Negative for dysuria. Musculoskeletal: Negative for back pain. Skin: Negative for rash. Neurological: Negative for headaches, focal weakness or numbness.   ____________________________________________   PHYSICAL EXAM:  VITAL SIGNS: ED Triage Vitals [08/01/18 2309]  Enc Vitals Group     BP (!) 168/93     Pulse Rate 98     Resp 18     Temp 100.3 F (37.9 C)     Temp Source Oral     SpO2 95 %     Weight 135 lb (61.2 kg)     Height 5\' 2"  (1.575 m)     Head Circumference      Peak Flow      Pain Score      Pain Loc      Pain Edu?      Excl. in GC?     Constitutional: Alert and oriented.  Frail appearing and in no acute distress. Eyes: Conjunctivae are normal. PERRL. EOMI. Head: Atraumatic. Nose: No congestion/rhinnorhea. Mouth/Throat: Mucous membranes are moist.  Oropharynx non-erythematous. Neck: No stridor.   Cardiovascular: Normal rate, regular rhythm. Grossly normal heart sounds.  Good peripheral circulation. Respiratory: Normal respiratory effort.  No retractions. Lungs CTAB. Gastrointestinal: Soft and nontender to light or deep palpation. No distention. No abdominal bruits. No CVA tenderness. Musculoskeletal: No lower extremity tenderness nor edema.  No joint effusions. Neurologic:  Normal speech and language. No gross focal neurologic deficits are appreciated.  Patient has wheelchair. Skin:  Skin is warm, dry and intact. No rash noted.  Petechiae. Psychiatric: Mood and affect are normal. Speech and behavior are normal.  ____________________________________________   LABS (all labs ordered are listed, but only abnormal results are displayed)  Labs Reviewed  COMPREHENSIVE METABOLIC PANEL - Abnormal; Notable for the following components:      Result Value   CO2 21 (*)    Glucose, Bld 159 (*)    Calcium 8.6 (*)    Total Protein 6.4 (*)    GFR calc non Af Amer 58 (*)    All other components within normal  limits  CBC WITH DIFFERENTIAL/PLATELET - Abnormal; Notable for the following components:   Neutro Abs 8.0 (*)    All other components within normal limits  URINALYSIS, COMPLETE (UACMP) WITH MICROSCOPIC - Abnormal; Notable for the following components:   Color, Urine YELLOW (*)    APPearance CLOUDY (*)    Hgb urine dipstick SMALL (*)    Protein, ur 30 (*)    Nitrite POSITIVE (*)    Leukocytes,Ua MODERATE (*)    WBC, UA >50 (*)    Bacteria, UA MANY (*)    All other components within normal limits  LACTIC ACID, PLASMA - Abnormal; Notable for the following components:   Lactic Acid, Venous 2.2 (*)    All other components within normal limits  CULTURE, BLOOD (ROUTINE X 2)  CULTURE, BLOOD (ROUTINE X 2)  URINE CULTURE  SARS CORONAVIRUS 2 (HOSPITAL ORDER, PERFORMED IN [place] [facility])  TROPONIN I  LACTIC ACID, PLASMA  LACTIC ACID, PLASMA   ____________________________________________  EKG  ED ECG REPORT I, Denali Sharma J, the attending physician, personally viewed and interpreted this ECG.   Date: 08/02/2018  EKG Time: 0651  Rate: 63  Rhythm: atrial fibrillation, rate 63  Axis: LAD  Intervals:none  ST&T Change: Nonspecific  ____________________________________________  RADIOLOGY  ED MD interpretation:  Probable patchy bilateral lower lobe pneumonia  Official radiology report(s): Dg Chest Port 1 View  Result Date: 08/02/2018 CLINICAL DATA:  Initial evaluation for acute sepsis. EXAM: PORTABLE CHEST 1 VIEW COMPARISON:  Prior radiograph from 05/23/2013. FINDINGS: Mild cardiomegaly, stable from previous. Mediastinal silhouette within normal limits. Aortic atherosclerosis. Lungs hypoinflated. Perihilar vascular congestion with mild diffuse interstitial prominence, suggesting mild pulmonary interstitial congestion/edema. Superimposed patchy bibasilar opacities, left greater than right, which could reflect atelectasis and/or infiltrates. Superimposed small left pleural  effusion. No pneumothorax. Severe fracture deformities and/or degenerative changes noted about the shoulders bilaterally. Underlying osteopenia. No acute osseous abnormality. IMPRESSION: 1. Cardiomegaly with mild diffuse pulmonary interstitial congestion/edema. 2. Superimposed patchy bibasilar opacities, left greater than right, which could reflect atelectasis and/or infiltrates. 3. Superimposed small left pleural effusion. 4. Aortic atherosclerosis. Electronically Signed   By: Rise MuBenjamin  McClintock M.D.   On: 08/02/2018 03:59    ____________________________________________   PROCEDURES  Procedure(s) performed (including Critical Care):  Procedures  CRITICAL CARE Performed by: Irean HongSUNG,Jacorie Ernsberger J   Total critical care time: 45 minutes  Critical care time was exclusive of separately billable procedures and treating other patients.  Critical care was necessary to treat or prevent imminent or life-threatening deterioration.  Critical care was time spent personally by me on the following activities: development of treatment plan with patient and/or surrogate as well as nursing, discussions with consultants, evaluation of patient's response to treatment, examination of patient, obtaining history from patient or surrogate, ordering and performing treatments and interventions, ordering and review of laboratory studies, ordering and review of radiographic  studies, pulse oximetry and re-evaluation of patient's condition. ____________________________________________   INITIAL IMPRESSION / ASSESSMENT AND PLAN / ED COURSE  As part of my medical decision making, I reviewed the following data within the electronic MEDICAL RECORD NUMBER History obtained from family, Nursing notes reviewed and incorporated, Labs reviewed, EKG interpreted, Old chart reviewed, Radiograph reviewed and Notes from prior ED visits     Clint Strupp was evaluated in Emergency Department on 08/02/2018 for the symptoms described in the  history of present illness. She was evaluated in the context of the global COVID-19 pandemic, which necessitated consideration that the patient might be at risk for infection with the SARS-CoV-2 virus that causes COVID-19. Institutional protocols and algorithms that pertain to the evaluation of patients at risk for COVID-19 are in a state of rapid change based on information released by regulatory bodies including the CDC and federal and state organizations. These policies and algorithms were followed during the patient's care in the ED.   85 year old female who presents with fever and weakness.  Differential diagnosis includes but is not limited to sepsis, UTI, CAP, COVID-19, etc.  Noted initial lactate 2.2.  Will repeat temperature, initiate code sepsis protocol with IV fluids and antibiotics.  Did discuss with daughter and recommended hospitalization. Daughter would prefer transfer to [facility] but ultimately would prefer to take patient home.  We agreed to infusing IV fluids, antibiotics and repeating lactate before making disposition decision.   Clinical Course as of Aug 01 656  Sun Aug 02, 2018  0507 Daughter refusing COVID test.   [JS]  0522 Updated patient and daughter.  Strongly urged the daughter that patient requires hospitalization given 2 sources of infection causing sepsis.  Daughter is adamantly refusing COVID test.  I explained to her that whether we hospitalized patient here or transfer her elsewhere that any hospital would require COVID testing.  Daughter will discuss with her husband and let me know their decision.   [JS]  [phone] Daughter had numerous questions which I answered to the best of my ability.  At this point she would like transfer to [facility].  I have cautioned patient area hospitals are full but I will call the transfer center to inquire about bed availability.   [JS]  [phone] [facility] transfer center contacted.  Will call back with receiving physician.   [JS]  [phone] Accepted to  [facility] by Dr. Thedore Mins. Awaiting Covid result for bed determination.    [JS]  [phone] Daughter is dissatisfied that patient was accepted to [facility] rather than "big [facility]". I have explained to her that we have no control over where within the [facility] system that patient is assigned. Daughter now refusing transfer and agreeable for admission at this facility.   [JS]    Clinical Course User Index [JS] Irean Hong, MD     ____________________________________________   FINAL CLINICAL IMPRESSION(S) / ED DIAGNOSES  Final diagnoses:  Sepsis, due to unspecified organism, unspecified whether acute organ dysfunction present [facility])  Community acquired pneumonia, unspecified laterality  Weakness     ED Discharge Orders    None       Note:  This document was prepared using Dragon voice recognition software and may include unintentional dictation errors.   Irean Hong, MD 08/02/18 [phone]

## 2018-08-02 NOTE — ED Notes (Signed)
Breakfast tray provided. Family at bedside. Denies needs at this time. Pending room assignment. Admitting MD has seen patient.

## 2018-08-02 NOTE — Progress Notes (Signed)
Family Meeting Note  Advance Directive:yes  Today a meeting took place with the Patient and daughter healthcare power of attorney at bedside  Patient is unable to participate due AN:VBTYOM capacity Delerious with underlying dementia   The following clinical team members were present during this meeting:MD  The following were discussed:Patient's diagnosis: Sepsis, pneumonia, parapneumonic effusion, urinary tract infection, chronic history of dementia, osteoarthritis legally blind in left eye, treatment plan of care discussed in detail with the patient and her daughter at bedside.  Daughter verbalized understanding of the plan   patient's progosis: Unable to determine and Goals for treatment: DNR  Daughter Carmine Savoy is the healthcare POA  Additional follow-up to be provided: Hospitalist   Time spent during discussion:17 min  Ramonita Lab, MD

## 2018-08-02 NOTE — ED Notes (Signed)
Now consenting to swab

## 2018-08-02 NOTE — Progress Notes (Signed)
Pharmacy Antibiotic Note  [name] is a 85 y.o. female admitted on 08/02/2018 with sepsis.  Pharmacy has been consulted for Aztreonam and Vancomycin dosing.  Plan: Aztreonam 2g IV every 8 hours.     Vancomycin 1g IV given in ED.  Ordered Vancomycin 750 mg IV Q 24 hrs. Goal AUC 400-550. Expected AUC: 512 SCr used: 0.8   Height: 5\' 2"  (157.5 cm) Weight: 135 lb (61.2 kg) IBW/kg (Calculated) : 50.1  Temp (24hrs), Avg:99.8 F (37.7 C), Min:99.2 F (37.3 C), Max:100.3 F (37.9 C)  Recent Labs  Lab 08/01/18 2315 08/02/18 0038 08/02/18 0630 08/02/18 0852  WBC 9.9  --   --  6.3  CREATININE 0.86  --   --  0.76  LATICACIDVEN  --  2.2* 1.1  --     Estimated Creatinine Clearance: 37.8 mL/min (by C-G formula based on SCr of 0.76 mg/dL).    Allergies  Allergen Reactions  . Ceftriaxone Other (See Comments) and Itching    Reaction: Unknown  . Penicillins Other (See Comments)    Reaction: Unknown    Antimicrobials this admission: Aztreonam 5/31 >>  Vancomycin  5/31 >>  Metronidazole 5/31 x 1 dose.  Dose adjustments this admission: N/A  Microbiology results: 5/31 Sputum: pending  5/31 MRSA PCR: pending 5/31 SARS-CoV-2 negative  Thank you for allowing pharmacy to be a part of this patient's care.  Stormy Card, [facility] 08/02/2018 12:41 PM

## 2018-08-02 NOTE — ED Notes (Addendum)
Pt's daughter, Carmine Savoy, declined second IV and CoVid-19 swab

## 2018-08-02 NOTE — H&P (Signed)
[facility] [place] NAME: [name]    MR#:  [id number]  DATE OF BIRTH:  [birth year]  DATE OF ADMISSION:  08/02/2018  PRIMARY CARE PHYSICIAN: Denton Lank, MD   REQUESTING/REFERRING PHYSICIAN: Dr. Beather Arbour  CHIEF COMPLAINT:  Worsening of of mental status and fever  HISTORY OF PRESENT ILLNESS:  [name]  is a 85 y.o. female with a known history of dementia, osteoarthritis and blind in the left eye is brought into the emergency department by her daughter for fever and worsening of her mental status.  Urinalysis is abnormal and chest x-ray with infiltrates versus atelectasis with interstitial edema.  Patient was given fluid bolus and broad-spectrum IV antibiotics are started.  Initially daughter has requested to be transferred to [facility] but eventually she has changed her mind and decided to stay with [facility].  Reviewed CT scan of the renal stone study ordered by the ED physician.  During my examination daughter is at bedside and patient is arousable to verbal commands could recognize her daughter but unable to answer any questions  PAST MEDICAL HISTORY:   Past Medical History:  Diagnosis Date  . Arthritis   . Blind left eye   . Dementia ([place])     PAST SURGICAL HISTOIRY:   Past Surgical History:  Procedure Laterality Date  . CHOLECYSTECTOMY      SOCIAL HISTORY:   Social History   Tobacco Use  . Smoking status: Never Smoker  . Smokeless tobacco: Former Systems developer    Types: Snuff  Substance Use Topics  . Alcohol use: No    Frequency: Never    FAMILY HISTORY:  No family history on file.  DRUG ALLERGIES:   Allergies  Allergen Reactions  . Ceftriaxone Other (See Comments) and Itching    Reaction: Unknown  . Penicillins Other (See Comments)    Reaction: Unknown    REVIEW OF SYSTEMS:  Review of system unobtainable as the patient is encephalopathic  MEDICATIONS AT HOME:   Prior to  Admission medications   Medication Sig Start Date End Date Taking? Authorizing Provider  ciprofloxacin (CIPRO) 500 MG tablet Take 1 tablet (500 mg total) by mouth 2 (two) times daily. Patient not taking: Reported on 08/02/2018 05/17/18   Delman Kitten, MD  naproxen (NAPROSYN) 250 MG tablet Take by mouth 2 (two) times daily with a meal.    [provider]      VITAL SIGNS:  Blood pressure 125/62, pulse 65, temperature 99.2 F (37.3 C), temperature source Rectal, resp. rate 18, height 5' 2"  (1.575 m), weight 61.2 kg, SpO2 98 %.  PHYSICAL EXAMINATION:  GENERAL:  85 y.o.-year-old patient lying in the bed with no acute distress.  EYES: Pupils equal, round, reactive to light and accommodation. No scleral icterus. Extraocular muscles intact.  Left eye is blind HEENT: Head atraumatic, normocephalic. Oropharynx and nasopharynx clear.  NECK:  Supple, no jugular venous distention. No thyroid enlargement, no tenderness.  LUNGS: Normal breath sounds bilaterally, no wheezing, rales,rhonchi or crepitation. No use of accessory muscles of respiration.  CARDIOVASCULAR: S1, S2 normal. No murmurs, rubs, or gallops.  ABDOMEN: Soft, nontender, nondistended. Bowel sounds present.  EXTREMITIES: No pedal edema, cyanosis, or clubbing.  NEUROLOGIC: Arousable to verbal commands, disoriented PSYCHIATRIC: The patient is arousable to verbal commands and disoriented SKIN: No obvious rash, lesion, or ulcer.   LABORATORY PANEL:   CBC Recent Labs  Lab 08/01/18 2315  WBC 9.9  HGB 13.1  HCT 39.6  PLT 153   ------------------------------------------------------------------------------------------------------------------  Chemistries  Recent Labs  Lab 08/01/18 2315  NA 140  K 3.5  CL 109  CO2 21*  GLUCOSE 159*  BUN 21  CREATININE 0.86  CALCIUM 8.6*  AST 24  ALT 12  ALKPHOS 117  BILITOT 0.9    ------------------------------------------------------------------------------------------------------------------  Cardiac Enzymes Recent Labs  Lab 08/01/18 2315  TROPONINI <0.03   ------------------------------------------------------------------------------------------------------------------  RADIOLOGY:  Dg Chest Port 1 View  Result Date: 08/02/2018 CLINICAL DATA:  Initial evaluation for acute sepsis. EXAM: PORTABLE CHEST 1 VIEW COMPARISON:  Prior radiograph from 05/23/2013. FINDINGS: Mild cardiomegaly, stable from previous. Mediastinal silhouette within normal limits. Aortic atherosclerosis. Lungs hypoinflated. Perihilar vascular congestion with mild diffuse interstitial prominence, suggesting mild pulmonary interstitial congestion/edema. Superimposed patchy bibasilar opacities, left greater than right, which could reflect atelectasis and/or infiltrates. Superimposed small left pleural effusion. No pneumothorax. Severe fracture deformities and/or degenerative changes noted about the shoulders bilaterally. Underlying osteopenia. No acute osseous abnormality. IMPRESSION: 1. Cardiomegaly with mild diffuse pulmonary interstitial congestion/edema. 2. Superimposed patchy bibasilar opacities, left greater than right, which could reflect atelectasis and/or infiltrates. 3. Superimposed small left pleural effusion. 4. Aortic atherosclerosis. Electronically Signed   By: Jeannine Boga M.D.   On: 08/02/2018 03:59    EKG:   Orders placed or performed during the hospital encounter of 08/02/18  . ED EKG 12-Lead  . ED EKG 12-Lead  . EKG 12-Lead  . EKG 12-Lead    IMPRESSION AND PLAN:    #Sepsis from UTI and pneumonia Admit patient to [place] unit Patient met septic criteria at the time of admission with fever and elevated lactic acid IV fluid bolus was given in the ED and lactic acid is trending down Blood cultures urine cultures and sputum cultures if patient can provide a sputum Will  provide broad-spectrum IV antibiotics aztreonam and vancomycin Daughter refused CT scan of the abdomen at this point of time as mom does not have any abdominal pain COVID negative Monitor lactic acid Incentive spirometry   #Metabolic encephalopathy from sepsis with a chronic history of underlying dementia Continue close monitoring Daughter was given permission to stay with mom given her clinical situation  #Pneumonia with the parapneumonic effusion Will provide broad-spectrum antibiotics and bronchodilators  #Urinary tract infection with history of bladder prolapse and daughter does in and out cath for her mom as needed Continue aztreonam Gentle hydration  #Chronic history of dementia Continue close monitoring patient is not on any medications at this time  #Legally blind in left eye  #Osteoarthritis continue naproxen home medication with food      All the records are reviewed and case discussed with ED provider. Management plans discussed with the patient, daughter David Stall at bedside and they are in agreement.  All daughter's questions were answered to her satisfaction  CODE STATUS: DNR daughter is a healthcare POA  TOTAL TIME TAKING CARE OF THIS PATIENT: 50 minutes.   Note: This dictation was prepared with Dragon dictation along with smaller phrase technology. Any transcriptional errors that result from this process are unintentional.  Nicholes Mango M.D on 08/02/2018 at 9:03 AM  Between 7am to 6pm - Pager - [phone]  After 6pm go to [email or web address] - password [email or web address] [facility]  [facility]  Office  [phone]  CC: Primary care physician; Denton Lank, MD

## 2018-08-02 NOTE — ED Notes (Signed)
Pt daughter request hold covid swab until [facility] location determined

## 2018-08-02 NOTE — ED Notes (Signed)
.. ED TO INPATIENT HANDOFF REPORT  ED Nurse Name and Phone #: Eula Flax  S Name/Age/Gender [name] 85 y.o. female Room/Bed: ED16A/ED16A  Code Status   Code Status: DNR  Home/SNF/Other Home Patient oriented to: self Is this baseline? No   Triage Complete: Triage complete  Chief Complaint Fever  Triage Note Family reports patient had fever at home tonight of 102 and has been complaining of a fever.  Patient was given 324 mg of Aspirin.  Family reports patient is more disoriented than usual.   Allergies Allergies  Allergen Reactions  . Ceftriaxone Other (See Comments) and Itching    Reaction: Unknown  . Penicillins Other (See Comments)    Reaction: Unknown    Level of Care/Admitting Diagnosis ED Disposition    ED Disposition Condition Comment   Admit  Hospital Area: [facility] [[id number]]  Level of Care: Med-Surg [16]  Covid Evaluation: Confirmed COVID Negative  Diagnosis: Sepsis [facility]) [[id number]]  Admitting Physician: Ramonita Lab [[id number]]  Attending Physician: Ramonita Lab [[id number]]  Estimated length of stay: past midnight tomorrow  Certification:: I certify this patient will need inpatient services for at least 2 midnights  Bed request comments: 1c  PT Class (Do Not Modify): Inpatient [101]  PT Acc Code (Do Not Modify): Private [1]       B Medical/Surgery History Past Medical History:  Diagnosis Date  . Arthritis   . Blind left eye   . Dementia [facility]    Past Surgical History:  Procedure Laterality Date  . CHOLECYSTECTOMY       A IV Location/Drains/Wounds Patient Lines/Drains/Airways Status   Active Line/Drains/Airways    Name:   Placement date:   Placement time:   Site:   Days:   Peripheral IV 08/02/18 Left Forearm   08/02/18    0430    Forearm   less than 1          Intake/Output Last 24 hours  Intake/Output Summary (Last 24 hours) at 08/02/2018 1356 Last data filed at 08/02/2018 1353 Gross per 24 hour  Intake  1297.04 ml  Output -  Net 1297.04 ml    Labs/Imaging Results for orders placed or performed during the hospital encounter of 08/02/18 (from the past 48 hour(s))  Comprehensive metabolic panel     Status: Abnormal   Collection Time: 08/01/18 11:15 PM  Result Value Ref Range   Sodium 140 135 - 145 mmol/L   Potassium 3.5 3.5 - 5.1 mmol/L   Chloride 109 98 - 111 mmol/L   CO2 21 (L) 22 - 32 mmol/L   Glucose, Bld 159 (H) 70 - 99 mg/dL   BUN 21 8 - 23 mg/dL   Creatinine, Ser 6.80 0.44 - 1.00 mg/dL   Calcium 8.6 (L) 8.9 - 10.3 mg/dL   Total Protein 6.4 (L) 6.5 - 8.1 g/dL   Albumin 3.7 3.5 - 5.0 g/dL   AST 24 15 - 41 U/L   ALT 12 0 - 44 U/L   Alkaline Phosphatase 117 38 - 126 U/L   Total Bilirubin 0.9 0.3 - 1.2 mg/dL   GFR calc non Af Amer 58 (L) >60 mL/min   GFR calc Af Amer >60 >60 mL/min   Anion gap 10 5 - 15    Comment: Performed at [facility], [address] [place], [place] [address]  CBC with Differential     Status: Abnormal   Collection Time: 08/01/18 11:15 PM  Result Value Ref Range   WBC 9.9  4.0 - 10.5 K/uL   RBC 4.19 3.87 - 5.11 MIL/uL   Hemoglobin 13.1 12.0 - 15.0 g/dL   HCT 16.139.6 09.636.0 - 04.546.0 %   MCV 94.5 80.0 - 100.0 fL   MCH 31.3 26.0 - 34.0 pg   MCHC 33.1 30.0 - 36.0 g/dL   RDW 40.913.2 81.111.5 - 91.415.5 %   Platelets 153 150 - 400 K/uL   nRBC 0.0 0.0 - 0.2 %   Neutrophils Relative % 81 %   Neutro Abs 8.0 (H) 1.7 - 7.7 K/uL   Lymphocytes Relative 15 %   Lymphs Abs 1.5 0.7 - 4.0 K/uL   Monocytes Relative 3 %   Monocytes Absolute 0.3 0.1 - 1.0 K/uL   Eosinophils Relative 1 %   Eosinophils Absolute 0.1 0.0 - 0.5 K/uL   Basophils Relative 0 %   Basophils Absolute 0.0 0.0 - 0.1 K/uL   Immature Granulocytes 0 %   Abs Immature Granulocytes 0.03 0.00 - 0.07 K/uL    Comment: Performed at [facility], [address] [place], [place] [address]  Culture, blood (Routine x 2)     Status: None (Preliminary result)   Collection Time: 08/01/18 11:15 PM   Result Value Ref Range   Specimen Description BLOOD RIGHT HAND    Special Requests      BOTTLES DRAWN AEROBIC AND ANAEROBIC Blood Culture adequate volume   Culture      NO GROWTH < 12 HOURS Performed at [facility], [address] [place], [place] [address]    Report Status PENDING   Troponin I - Add-On to previous collection     Status: None   Collection Time: 08/01/18 11:15 PM  Result Value Ref Range   Troponin I <0.03 <0.03 ng/mL    Comment: Performed at [facility], [address] [place], [place] [address]  Culture, blood (Routine x 2)     Status: None (Preliminary result)   Collection Time: 08/02/18 12:38 AM  Result Value Ref Range   Specimen Description BLOOD LEFT ANTECUBITAL    Special Requests      BOTTLES DRAWN AEROBIC AND ANAEROBIC Blood Culture adequate volume   Culture      NO GROWTH < 12 HOURS Performed at [facility], [address] [place], [place] [address]    Report Status PENDING   Lactic acid, plasma     Status: Abnormal   Collection Time: 08/02/18 12:38 AM  Result Value Ref Range   Lactic Acid, Venous 2.2 (HH) 0.5 - 1.9 mmol/L    Comment: CRITICAL RESULT CALLED TO, READ BACK BY AND VERIFIED WITH ANN CALES ON 08/02/18 AT 0123 [facility] Performed at [facility], [address] [place], [place] [address]   Urinalysis, Complete w Microscopic     Status: Abnormal   Collection Time: 08/02/18  4:15 AM  Result Value Ref Range   Color, Urine YELLOW (A) YELLOW   APPearance CLOUDY (A) CLEAR   Specific Gravity, Urine 1.018 1.005 - 1.030   pH 5.0 5.0 - 8.0   Glucose, UA NEGATIVE NEGATIVE mg/dL   Hgb urine dipstick SMALL (A) NEGATIVE   Bilirubin Urine NEGATIVE NEGATIVE   Ketones, ur NEGATIVE NEGATIVE mg/dL   Protein, ur 30 (A) NEGATIVE mg/dL   Nitrite POSITIVE (A) NEGATIVE   Leukocytes,Ua MODERATE (A) NEGATIVE   RBC / HPF 0-5 0 - 5 RBC/hpf   WBC, UA >50 (H) 0 - 5 WBC/hpf   Bacteria, UA MANY (A) NONE SEEN  Squamous  Epithelial / LPF 0-5 0 - 5    Comment: Performed at [facility], [address] [place], [place] [address]  Lactic acid, plasma     Status: None   Collection Time: 08/02/18  6:30 AM  Result Value Ref Range   Lactic Acid, Venous 1.1 0.5 - 1.9 mmol/L    Comment: Performed at [facility], [address], [place], [place] [address]  SARS Coronavirus 2 (CEPHEID - Performed in [facility] hospital lab), Hosp Order     Status: None   Collection Time: 08/02/18  6:30 AM  Result Value Ref Range   SARS Coronavirus 2 NEGATIVE NEGATIVE    Comment: (NOTE) If result is NEGATIVE SARS-CoV-2 target nucleic acids are NOT DETECTED. The SARS-CoV-2 RNA is generally detectable in upper and lower  respiratory specimens during the acute phase of infection. The lowest  concentration of SARS-CoV-2 viral copies this assay can detect is 250  copies / mL. A negative result does not preclude SARS-CoV-2 infection  and should not be used as the sole basis for treatment or other  patient management decisions.  A negative result may occur with  improper specimen collection / handling, submission of specimen other  than nasopharyngeal swab, presence of viral mutation(s) within the  areas targeted by this assay, and inadequate number of viral copies  (<250 copies / mL). A negative result must be combined with clinical  observations, patient history, and epidemiological information. If result is POSITIVE SARS-CoV-2 target nucleic acids are DETECTED. The SARS-CoV-2 RNA is generally detectable in upper and lower  respiratory specimens dur ing the acute phase of infection.  Positive  results are indicative of active infection with SARS-CoV-2.  Clinical  correlation with patient history and other diagnostic information is  necessary to determine patient infection status.  Positive results do  not rule out bacterial infection or co-infection with other viruses. If result is PRESUMPTIVE  POSTIVE SARS-CoV-2 nucleic acids MAY BE PRESENT.   A presumptive positive result was obtained on the submitted specimen  and confirmed on repeat testing.  While 2019 novel coronavirus  (SARS-CoV-2) nucleic acids may be present in the submitted sample  additional confirmatory testing may be necessary for epidemiological  and / or clinical management purposes  to differentiate between  SARS-CoV-2 and other Sarbecovirus currently known to infect humans.  If clinically indicated additional testing with an alternate test  methodology [phone]) is advised. The SARS-CoV-2 RNA is generally  detectable in upper and lower respiratory sp ecimens during the acute  phase of infection. The expected result is Negative. Fact Sheet for Patients:  BoilerBrush.com.cy Fact Sheet for Healthcare Providers: https://pope.com/ This test is not yet approved or cleared by the [place] FDA and has been authorized for detection and/or diagnosis of SARS-CoV-2 by FDA under an Emergency Use Authorization (EUA).  This EUA will remain in effect (meaning this test can be used) for the duration of the COVID-19 declaration under Section 564(b)(1) of the Act, 21 U.S.C. section 360bbb-3(b)(1), unless the authorization is terminated or revoked sooner. Performed at [facility], [address] [place], [place] [address]   CBC     Status: Abnormal   Collection Time: 08/02/18  8:52 AM  Result Value Ref Range   WBC 6.3 4.0 - 10.5 K/uL   RBC 3.56 (L) 3.87 - 5.11 MIL/uL   Hemoglobin 11.2 (L) 12.0 - 15.0 g/dL   HCT 56.2 (L) 13.0 - 86.5 %   MCV 95.8 80.0 -  100.0 fL   MCH 31.5 26.0 - 34.0 pg   MCHC 32.8 30.0 - 36.0 g/dL   RDW 96.0 45.4 - 09.8 %   Platelets 120 (L) 150 - 400 K/uL    Comment: Immature Platelet Fraction may be clinically indicated, consider ordering this additional test [id number]    nRBC 0.0 0.0 - 0.2 %    Comment: Performed at [facility], [address] [place], [place] [address]  Creatinine, serum     Status: None   Collection Time: 08/02/18  8:52 AM  Result Value Ref Range   Creatinine, Ser 0.76 0.44 - 1.00 mg/dL   GFR calc non Af Amer >60 >60 mL/min   GFR calc Af Amer >60 >60 mL/min    Comment: Performed at [facility], [address], [place], [place] [address]   Dg Chest Port 1 View  Result Date: 08/02/2018 CLINICAL DATA:  Initial evaluation for acute sepsis. EXAM: PORTABLE CHEST 1 VIEW COMPARISON:  Prior radiograph from 05/23/2013. FINDINGS: Mild cardiomegaly, stable from previous. Mediastinal silhouette within normal limits. Aortic atherosclerosis. Lungs hypoinflated. Perihilar vascular congestion with mild diffuse interstitial prominence, suggesting mild pulmonary interstitial congestion/edema. Superimposed patchy bibasilar opacities, left greater than right, which could reflect atelectasis and/or infiltrates. Superimposed small left pleural effusion. No pneumothorax. Severe fracture deformities and/or degenerative changes noted about the shoulders bilaterally. Underlying osteopenia. No acute osseous abnormality. IMPRESSION: 1. Cardiomegaly with mild diffuse pulmonary interstitial congestion/edema. 2. Superimposed patchy bibasilar opacities, left greater than right, which could reflect atelectasis and/or infiltrates. 3. Superimposed small left pleural effusion. 4. Aortic atherosclerosis. Electronically Signed   By: Rise Mu M.D.   On: 08/02/2018 03:59    Pending Labs Unresulted Labs (From admission, onward)    Start     Ordered   08/09/18 0500  Creatinine, serum  (enoxaparin (LOVENOX)    CrCl >/= 30 ml/min)  Weekly,   STAT    Comments:  while on enoxaparin therapy    08/02/18 0855   08/03/18 0500  Protime-INR  Tomorrow morning,   STAT     08/02/18 0855   08/03/18 0500  Cortisol-am, blood  Tomorrow morning,   STAT     08/02/18 0855   08/03/18 0500  Procalcitonin  Tomorrow morning,   STAT      08/02/18 0855   08/03/18 0500  Comprehensive metabolic panel  Tomorrow morning,   STAT     08/02/18 0855   08/03/18 0500  CBC  Tomorrow morning,   STAT     08/02/18 0855   08/02/18 0858  MRSA PCR Screening  Once,   STAT     08/02/18 0858   08/02/18 0854  Culture, sputum-assessment  Once,   STAT     08/02/18 0855   08/02/18 0324  Urine culture  ONCE - STAT,   STAT     08/02/18 0324          Vitals/Pain Today's Vitals   08/02/18 0930 08/02/18 0945 08/02/18 1100 08/02/18 1200  BP:    (!) 112/45  Pulse: 65 64 63 64  Resp: Temp:      TempSrc:      SpO2: 98% 97% 98% 97%  Weight:      Height:      PainSc:        Isolation Precautions No active isolations  Medications Medications  enoxaparin (LOVENOX) injection 40 mg (has no administration in time range)  ondansetron (ZOFRAN) tablet 4 mg (has  no administration in time range)    Or  ondansetron (ZOFRAN) injection 4 mg (has no administration in time range)  acetaminophen (TYLENOL) tablet 650 mg (has no administration in time range)    Or  acetaminophen (TYLENOL) suppository 650 mg (has no administration in time range)  docusate sodium (COLACE) capsule 100 mg (has no administration in time range)  aztreonam (AZACTAM) 2 g in sodium chloride 0.9 % 100 mL IVPB (0 g Intravenous Stopped 08/02/18 1353)  vancomycin (VANCOCIN) IVPB 750 mg/150 ml premix (has no administration in time range)  sodium chloride 0.9 % bolus 1,000 mL (0 mLs Intravenous Stopped 08/02/18 0505)    And  sodium chloride 0.9 % bolus 1,000 mL (0 mLs Intravenous Stopped 08/02/18 0741)  aztreonam (AZACTAM) 2 g in sodium chloride 0.9 % 100 mL IVPB (0 g Intravenous Stopped 08/02/18 0500)  metroNIDAZOLE (FLAGYL) IVPB 500 mg ( Intravenous Stopped 08/02/18 0607)  vancomycin (VANCOCIN) IVPB 1000 mg/200 mL premix (0 mg Intravenous Stopped 08/02/18 0741)  furosemide (LASIX) injection 20 mg (20 mg Intravenous Given 08/02/18 1011)    Mobility walks with  device High fall risk   Focused Assessments UTI   R Recommendations: See Admitting Provider Note  Report given to:   Additional Notes:

## 2018-08-03 ENCOUNTER — Inpatient Hospital Stay
Admit: 2018-08-03 | Discharge: 2018-08-03 | Disposition: A | Discharge status: Home / Self Care | Payer: Medicare Other | Attending: Internal Medicine | Admitting: Internal Medicine

## 2018-08-03 LAB — CBC
HCT: 33.6 % — ABNORMAL LOW (ref 36.0–46.0)
Hemoglobin: 10.8 g/dL — ABNORMAL LOW (ref 12.0–15.0)
MCH: 30.8 pg (ref 26.0–34.0)
MCHC: 32.1 g/dL (ref 30.0–36.0)
MCV: 95.7 fL (ref 80.0–100.0)
Platelets: 125 10*3/uL — ABNORMAL LOW (ref 150–400)
RBC: 3.51 MIL/uL — ABNORMAL LOW (ref 3.87–5.11)
RDW: 13.4 % (ref 11.5–15.5)
WBC: 5.3 10*3/uL (ref 4.0–10.5)
nRBC: 0 % (ref 0.0–0.2)

## 2018-08-03 LAB — ECHOCARDIOGRAM COMPLETE
Height: 62 in
Weight: 2272 oz

## 2018-08-03 LAB — URINE CULTURE: Culture: >=100000 — AB

## 2018-08-03 LAB — COMPREHENSIVE METABOLIC PANEL
ALT: 13 U/L (ref 0–44)
AST: 27 U/L (ref 15–41)
Albumin: 2.7 g/dL — ABNORMAL LOW (ref 3.5–5.0)
Alkaline Phosphatase: 88 U/L (ref 38–126)
Anion gap: 5 (ref 5–15)
BUN: 20 mg/dL (ref 8–23)
CO2: 25 mmol/L (ref 22–32)
Calcium: 7.9 mg/dL — ABNORMAL LOW (ref 8.9–10.3)
Chloride: 114 mmol/L — ABNORMAL HIGH (ref 98–111)
Creatinine, Ser: 0.87 mg/dL (ref 0.44–1.00)
GFR calc Af Amer: >60 mL/min (ref >60)
GFR calc non Af Amer: 57 mL/min — ABNORMAL LOW (ref >60)
Glucose, Bld: 79 mg/dL (ref 70–99)
Potassium: 3.3 mmol/L — ABNORMAL LOW (ref 3.5–5.1)
Sodium: 144 mmol/L (ref 135–145)
Total Bilirubin: 0.8 mg/dL (ref 0.3–1.2)
Total Protein: 5 g/dL — ABNORMAL LOW (ref 6.5–8.1)

## 2018-08-03 LAB — PROCALCITONIN: Procalcitonin: <0.1 ng/mL

## 2018-08-03 LAB — PROTIME-INR
INR: 1.3 — ABNORMAL HIGH (ref 0.8–1.2)
Prothrombin Time: 15.8 seconds — ABNORMAL HIGH (ref 11.4–15.2)

## 2018-08-03 LAB — CORTISOL-AM, BLOOD: Cortisol - AM: 9.9 ug/dL (ref 6.7–22.6)

## 2018-08-03 MED ORDER — POTASSIUM CHLORIDE CRYS ER 20 MEQ PO TBCR
40.0000 meq | EXTENDED_RELEASE_TABLET | Freq: Once | ORAL | Status: AC
  Administered 2018-08-03: 10:00:00 40 meq via ORAL
  Filled 2018-08-03: qty 2

## 2018-08-03 NOTE — Progress Notes (Signed)
[facility] [place] NAME: [name]    MR#:  [id number]  DATE OF BIRTH:  09/18/1922  SUBJECTIVE:  CHIEF COMPLAINT: Patient is clinically getting better more awake and alert today.  Daughter at bedside.  Intermittent episodes of cough as reported by the patient's daughter  REVIEW OF SYSTEMS:  Review of system unobtainable as the patient has baseline dementia  DRUG ALLERGIES:   Allergies  Allergen Reactions  . Ceftriaxone Other (See Comments) and Itching    Reaction: Unknown  . Penicillins Other (See Comments)    Reaction: Unknown    VITALS:  Blood pressure (!) 166/78, pulse (!) 54, temperature 98.4 F (36.9 C), temperature source Oral, resp. rate 15, height _0  (1.575 m), weight 64.4 kg, SpO2 98 %.  PHYSICAL EXAMINATION:  GENERAL:  85 y.o.-year-old patient lying in the bed with no acute distress.  EYES: Pupils equal, round, reactive to light and accommodation. No scleral icterus. Extraocular muscles intact.  HEENT: Head atraumatic, normocephalic. Oropharynx and nasopharynx clear.  NECK:  Supple, no jugular venous distention. No thyroid enlargement, no tenderness.  LUNGS: Moderate breath sounds bilaterally, no wheezing, rales,rhonchi or crepitation. No use of accessory muscles of respiration.  CARDIOVASCULAR: S1, S2 normal. No murmurs, rubs, or gallops.  ABDOMEN: Soft, nontender, nondistended. Bowel sounds present.  EXTREMITIES: No pedal edema, cyanosis, or clubbing.  NEUROLOGIC: Awake alert and disoriented. Sensation intact. Gait not checked.  PSYCHIATRIC: The patient is disoriented.  SKIN: No obvious rash, lesion, or ulcer.    LABORATORY PANEL:   CBC Recent Labs  Lab 08/03/18 0500  WBC 5.3  HGB 10.8*  HCT 33.6*  PLT 125*   ------------------------------------------------------------------------------------------------------------------  Chemistries  Recent Labs  Lab 08/03/18 0500  NA 144  K 3.3*  CL  114*  CO2 25  GLUCOSE 79  BUN 20  CREATININE 0.87  CALCIUM 7.9*  AST 27  ALT 13  ALKPHOS 88  BILITOT 0.8   ------------------------------------------------------------------------------------------------------------------  Cardiac Enzymes Recent Labs  Lab 08/01/18 2315  TROPONINI <0.03   ------------------------------------------------------------------------------------------------------------------  RADIOLOGY:  Dg Chest Port 1 View  Result Date: 08/02/2018 CLINICAL DATA:  Initial evaluation for acute sepsis. EXAM: PORTABLE CHEST 1 VIEW COMPARISON:  Prior radiograph from 05/23/2013. FINDINGS: Mild cardiomegaly, stable from previous. Mediastinal silhouette within normal limits. Aortic atherosclerosis. Lungs hypoinflated. Perihilar vascular congestion with mild diffuse interstitial prominence, suggesting mild pulmonary interstitial congestion/edema. Superimposed patchy bibasilar opacities, left greater than right, which could reflect atelectasis and/or infiltrates. Superimposed small left pleural effusion. No pneumothorax. Severe fracture deformities and/or degenerative changes noted about the shoulders bilaterally. Underlying osteopenia. No acute osseous abnormality. IMPRESSION: 1. Cardiomegaly with mild diffuse pulmonary interstitial congestion/edema. 2. Superimposed patchy bibasilar opacities, left greater than right, which could reflect atelectasis and/or infiltrates. 3. Superimposed small left pleural effusion. 4. Aortic atherosclerosis. Electronically Signed   By: Jeannine Boga M.D.   On: 08/02/2018 03:59    EKG:   Orders placed or performed during the hospital encounter of 08/02/18  . ED EKG 12-Lead  . ED EKG 12-Lead  . EKG 12-Lead  . EKG 12-Lead    ASSESSMENT AND PLAN:   #Sepsis from UTI and pneumonia Patient met septic criteria at the time of admission with fever and elevated lactic acid IV fluid bolus was given in the ED and lactic acid is trending down Blood  cultures are negative urine cultures pending and sputum cultures if patient can provide a sputum Will provide broad-spectrum IV antibiotics aztreonam and  vancomycin Daughter refused CT scan of the abdomen at this point of time as mom does not have any abdominal pain COVID negative Monitor lactic acid Incentive spirometry  #Hypokalemia replete and recheck in a.m.   #Metabolic encephalopathy from sepsis with a chronic history of underlying dementia Clinically improving Continue close monitoring Daughter was given permission to stay with mom given her clinical situation  #Pneumonia with the parapneumonic effusion Will provide broad-spectrum antibiotics and bronchodilators Echocardiogram done results are pending  #Urinary tract infection with history of bladder prolapse and daughter does in and out cath for her mom as needed Continue aztreonam Gentle hydration  #Chronic history of dementia Continue close monitoring patient is not on any medications at this time  #Legally blind in left eye  #Osteoarthritis continue naproxen home medication with food  #Generalized weakness physical therapy evaluation recommending home health PT 24-hour supervision    All the records are reviewed and case discussed with Care Management/Social Workerr. Management plans discussed with the patient, daughter at bedside and they are in agreement.  CODE STATUS: DO NOT RESUSCITATE  TOTAL TIME TAKING CARE OF THIS PATIENT: 35 minutes.   POSSIBLE D/C IN 1-2 DAYS, DEPENDING ON CLINICAL CONDITION.  Note: This dictation was prepared with Dragon dictation along with smaller phrase technology. Any transcriptional errors that result from this process are unintentional.   Nicholes Mango M.D on 08/03/2018 at 2:55 PM  Between 7am to 6pm - Pager - [phone] After 6pm go to [email or web address] - password [email or web address] [facility]  [facility]  Office  [phone]  CC: Primary care physician; Denton Lank,  MD

## 2018-08-03 NOTE — TOC Initial Note (Signed)
Transition of Care [facility]) - Initial/Assessment Note    Patient Details  Name: [name] MRN: [id number] Date of Birth: [birth year]  Transition of Care [facility]) CM/SW Contact:    Virgel Manifold, RN Phone Number: 08/03/2018, 12:13 PM  Clinical Narrative:  [facility] team consulted to assist with disposition after PT had recommended home health. Patient lives with her daughter [name]. Patient essentially has 24 hour supervision in the home between family. Patient uses a rolling walker in the home. Daughter reports that patient is able to complete activities of daily living with minimal assistance. Patients daughter tells me patient has recently used home health and that they did not feel it was necessary. [name] tells me she is able to complete therapy exercises in the home. [name] politely declines home health. [name] understands how to have PCP order home health if needed. PCP is [name]. [facility] and is able to obtains medications without issues. [name] will gladly let our team know if she changes her mind prior to discharge.                  Expected Discharge Plan: Home/Self Care Barriers to Discharge: Continued Medical Work up   Patient Goals and CMS Choice Patient states their goals for this hospitalization and ongoing recovery are:: just to get her home CMS Medicare.gov Compare Post Acute Care list provided to:: Patient Represenative (must comment)(daughter) Choice offered to / list presented to : Adult Children  Expected Discharge Plan and Services Expected Discharge Plan: Home/Self Care   Discharge Planning Services: CM Consult Post Acute Care Choice: Home Health                             HH Arranged: Patient Refused HH, Refused HH          Prior Living Arrangements/Services   Lives with:: Adult Children Patient language and need for interpreter reviewed:: Yes Do you feel safe going back to the place where you live?: Yes      Need for Family Participation in  Patient Care: No (Comment)     Criminal Activity/Legal Involvement Pertinent to Current Situation/Hospitalization: No - Comment as needed  Activities of Daily Living Home Assistive Devices/Equipment: Walker (specify type) ADL Screening (condition at time of admission) Patient's cognitive ability adequate to safely complete daily activities?: No Is the patient deaf or have difficulty hearing?: Yes Does the patient have difficulty seeing, even when wearing glasses/contacts?: No Does the patient have difficulty concentrating, remembering, or making decisions?: Yes Patient able to express need for assistance with ADLs?: No Does the patient have difficulty dressing or bathing?: Yes Independently performs ADLs?: No Communication: Independent Dressing (OT): Needs assistance Is this a change from baseline?: Pre-admission baseline Grooming: Needs assistance Is this a change from baseline?: Pre-admission baseline Feeding: Independent Bathing: Needs assistance Is this a change from baseline?: Pre-admission baseline Toileting: Needs assistance Is this a change from baseline?: Pre-admission baseline In/Out Bed: Needs assistance Is this a change from baseline?: Pre-admission baseline Walks in Home: Dependent Is this a change from baseline?: Pre-admission baseline Does the patient have difficulty walking or climbing stairs?: Yes Weakness of Legs: Both Weakness of Arms/Hands: Both  Permission Sought/Granted                  Emotional Assessment Appearance:: Appears stated age Attitude/Demeanor/Rapport: Unable to Assess Affect (typically observed): Accepting Orientation: : Oriented to Self, Oriented to Place      Admission  diagnosis:  Weakness [R53.1] Community acquired pneumonia, unspecified laterality [J18.9] Sepsis, due to unspecified organism, unspecified whether acute organ dysfunction present [facility](HCC) [A41.9] Patient Active Problem List   Diagnosis Date Noted  . Sepsis (HCC)  08/02/2018   PCP:  [name], Sarah, MD Pharmacy:   [facility] [address] [facility] [place], [place] - [address] [place] [place] [address] Phone: [phone] Fax: [phone]     Social Determinants of Health (SDOH) Interventions    Readmission Risk Interventions Readmission Risk Prevention Plan 08/03/2018  Post Dischage Appt Complete  Medication Screening Complete  Transportation Screening Complete  Some recent data might be hidden

## 2018-08-03 NOTE — Evaluation (Signed)
Physical Therapy Evaluation Patient Details Name: [name] MRN: [id number] DOB: [birth year] Today's Date: 08/03/2018   History of Present Illness  presented to ER secondary to fever, AMS; admitted secondary to sepsis secondary to UTI, PNA, metabolic encephalopathy.  Clinical Impression  Upon evaluation, patient alert and oriented to self only; follows simple commands throughout session.  Chronic ROM deficits to bilat shoulders (history of previous injury); otherwise, bilat UE/LE strength and ROM grossly symmetrical and WFL for basic transfers and mobility.  Able to complete bed mobility with min assist; sit/stand, basic transfers and gait (5') with RW, cga/min assist.  Short, choppy steps, but no overt buckling or LOB; do recommend continued use of RW and +1 assist with all mobility. Would benefit from skilled PT to address above deficits and promote optimal return to PLOF; Recommend transition to HHPT upon discharge from acute hospitalization.     Follow Up Recommendations Home health PT    Equipment Recommendations  Rolling walker with 5" wheels    Recommendations for Other Services       Precautions / Restrictions Precautions Precautions: Fall Restrictions Weight Bearing Restrictions: No      Mobility  Bed Mobility Overal bed mobility: Needs Assistance Bed Mobility: Supine to Sit     Supine to sit: Min assist     General bed mobility comments: HOB elevated to max capacity (to simulate home environment); slow and effortful, heavy use of bedrails to complete  Transfers Overall transfer level: Needs assistance Equipment used: Rolling walker (2 wheeled) Transfers: Sit to/from Stand Sit to Stand: Min assist;Min guard         General transfer comment: cuing for hand placement; excessive forward trunk lean to assist with lift off  Ambulation/Gait Ambulation/Gait assistance: Min guard;Min assist Gait Distance (Feet): 5 Feet Assistive device: Rolling walker (2  wheeled)       General Gait Details: short, choppy steps with RW; limited balance reactions  Stairs            Wheelchair Mobility    Modified Rankin (Stroke Patients Only)       Balance Overall balance assessment: Needs assistance Sitting-balance support: No upper extremity supported;Feet supported Sitting balance-Leahy Scale: Good     Standing balance support: Bilateral upper extremity supported Standing balance-Leahy Scale: Fair                               Pertinent Vitals/Pain Pain Assessment: No/denies pain    Home Living Family/patient expects to be discharged to:: Private residence Living Arrangements: Children Available Help at Discharge: Family Type of Home: House Home Access: Level entry     Home Layout: One level Home Equipment: Environmental consultant - 4 wheels      Prior Function Level of Independence: Needs assistance         Comments: Sup for bed mobility, household distances with 4UJW; daughter assists with ADLs and mobility as needed.  Does endorse at least 2-3 falls within previous six months (most recent approx 1-2 weeks prior to admission)     Hand Dominance        Extremity/Trunk Assessment   Upper Extremity Assessment Upper Extremity Assessment: (elevation, all planes, limited approx 30-40 degrees (history of humeral fractures); elbow, wrist and hand WFL)    Lower Extremity Assessment Lower Extremity Assessment: (grossly 4-/5 throughout)       Communication   Communication: No difficulties  Cognition Arousal/Alertness: Awake/alert Behavior During Therapy: WFL for tasks assessed/performed  Overall Cognitive Status: History of cognitive impairments - at baseline                                 General Comments: oriented to self only; follows simple commands      General Comments      Exercises     Assessment/Plan    PT Assessment Patient needs continued PT services  PT Problem List Decreased  strength;Decreased activity tolerance;Decreased mobility;Decreased coordination;Decreased cognition;Decreased balance;Decreased knowledge of use of DME;Decreased safety awareness;Decreased knowledge of precautions       PT Treatment Interventions DME instruction;Gait training;Functional mobility training;Therapeutic activities;Therapeutic exercise;Balance training;Cognitive remediation;Patient/family education    PT Goals (Current goals can be found in the Care Plan section)  Acute Rehab PT Goals Patient Stated Goal: to return home with daughter PT Goal Formulation: With patient/family Time For Goal Achievement: 08/17/18 Potential to Achieve Goals: Good    Frequency Min 2X/week   Barriers to discharge        Co-evaluation               AM-PAC PT "6 Clicks" Mobility  Outcome Measure Help needed turning from your back to your side while in a flat bed without using bedrails?: A Little Help needed moving from lying on your back to sitting on the side of a flat bed without using bedrails?: A Little Help needed moving to and from a bed to a chair (including a wheelchair)?: A Little Help needed standing up from a chair using your arms (e.g., wheelchair or bedside chair)?: A Little Help needed to walk in hospital room?: A Little Help needed climbing 3-5 steps with a railing? : A Little 6 Click Score: 18    End of Session Equipment Utilized During Treatment: Gait belt Activity Tolerance: Patient tolerated treatment well Patient left: in chair;with call bell/phone within reach;with chair alarm set;with family/visitor present Nurse Communication: Mobility status PT Visit Diagnosis: Muscle weakness (generalized) (M62.81);Difficulty in walking, not elsewhere classified (R26.2)    Time: [id number] PT Time Calculation (min) (ACUTE ONLY): 27 min   Charges:   PT Evaluation $PT Eval Moderate Complexity: 1 Mod PT Treatments $Therapeutic Activity: 8-22 mins       Shevelle Smither H. Manson PasseyBrown,  PT, DPT, NCS 08/03/18, 11:52 AM [phone]

## 2018-08-03 NOTE — Progress Notes (Signed)
*  PRELIMINARY RESULTS* Echocardiogram 2D Echocardiogram has been performed.  Cristela Blue 08/03/2018, 9:28 AM

## 2018-08-04 LAB — CREATININE, SERUM
Creatinine, Ser: 0.72 mg/dL (ref 0.44–1.00)
GFR calc Af Amer: >60 mL/min (ref >60)
GFR calc non Af Amer: >60 mL/min (ref >60)

## 2018-08-04 MED ORDER — SULFAMETHOXAZOLE-TRIMETHOPRIM 800-160 MG PO TABS
1.0000 | ORAL_TABLET | Freq: Two times a day (BID) | ORAL | Status: DC
  Filled 2018-08-04 (×2): qty 1

## 2018-08-04 MED ORDER — ACETAMINOPHEN 325 MG PO TABS: 650.0000 mg | ORAL_TABLET | Freq: Four times a day (QID) | ORAL | Status: AC | PRN

## 2018-08-04 MED ORDER — MUPIROCIN 2 % EX OINT: 1.0000 "application " | TOPICAL_OINTMENT | Freq: Two times a day (BID) | CUTANEOUS | 0 refills | Status: AC

## 2018-08-04 MED ORDER — POTASSIUM CHLORIDE CRYS ER 20 MEQ PO TBCR: 40.0000 meq | EXTENDED_RELEASE_TABLET | Freq: Once | ORAL | Status: DC

## 2018-08-04 MED ORDER — SULFAMETHOXAZOLE-TRIMETHOPRIM 800-160 MG PO TABS: 1.0000 | ORAL_TABLET | Freq: Two times a day (BID) | ORAL | 0 refills | Status: AC

## 2018-08-04 NOTE — Progress Notes (Signed)
[name] to be D/C'd Home per MD order.  Discussed prescriptions and follow up appointments with the patient. Prescriptions given to patient, medication list explained in detail. Pt verbalized understanding.  Allergies as of 08/04/2018      Reactions   Ceftriaxone Other (See Comments), Itching   Reaction: Unknown   Penicillins Other (See Comments)   Reaction: Unknown      Medication List    STOP taking these medications   ciprofloxacin 500 MG tablet Commonly known as:  Cipro     TAKE these medications   acetaminophen 325 MG tablet Commonly known as:  TYLENOL Take 2 tablets (650 mg total) by mouth every 6 (six) hours as needed for mild pain or headache (or Fever >/= 101).   mupirocin ointment 2 % Commonly known as:  BACTROBAN Place 1 application into the nose 2 (two) times daily for 5 days.   naproxen 250 MG tablet Commonly known as:  NAPROSYN Take by mouth 2 (two) times daily with a meal.   sulfamethoxazole-trimethoprim 800-160 MG tablet Commonly known as:  BACTRIM DS Take 1 tablet by mouth every 12 (twelve) hours for 5 days.       Vitals:   08/03/18 1934 08/04/18 0457  BP: (!) 177/81 (!) 139/46  Pulse: 68 63  Resp: 17 19  Temp:  98.6 F (37 C)  SpO2: 98% 97%    Skin clean, dry and intact without evidence of skin break down, no evidence of skin tears noted. IV catheter discontinued intact. Site without signs and symptoms of complications. Dressing and pressure applied. Pt denies pain at this time. No complaints noted.  An After Visit Summary was printed and given to the patient. Patient escorted via WC, and D/C home via private auto.  Madie Reno, RN

## 2018-08-04 NOTE — Discharge Instructions (Signed)
Follow-up with primary care physician on June 5 as scheduled

## 2018-08-04 NOTE — TOC Transition Note (Signed)
Transition of Care [facility]) - CM/SW Discharge Note   Patient Details  Name: [name] MRN: [id number] Date of Birth: [birth year]  [facility] CM/SW Contact:  Chapman Fitch, RN Phone Number: 08/04/2018, 11:43 AM   Clinical Narrative:     Patient to discharge home today.  MD has ordered home health services.  RNCM follow up with daughter who is at bedside to confirm that she still wishes to decline home health services.  Daughter confirms and states if they changed their mind they will reach out to PCP.  MD notified  Final next level of care: Home/Self Care Barriers to Discharge: Continued Medical Work up   Patient Goals and CMS Choice Patient states their goals for this hospitalization and ongoing recovery are:: just to get her home CMS Medicare.gov Compare Post Acute Care list provided to:: Patient Represenative (must comment)(daughter) Choice offered to / list presented to : Adult Children  Discharge Placement                       Discharge Plan and Services   Discharge Planning Services: CM Consult Post Acute Care Choice: Home Health                    HH Arranged: Refused HH          Social Determinants of Health (SDOH) Interventions     Readmission Risk Interventions Readmission Risk Prevention Plan 08/03/2018  Post Dischage Appt Complete  Medication Screening Complete  Transportation Screening Complete  Some recent data might be hidden

## 2018-08-04 NOTE — Discharge Summary (Signed)
[place] [facility] [place] NAME: [name]    MR#:  [id number]  DATE OF BIRTH:  1922-06-11  DATE OF ADMISSION:  08/02/2018 ADMITTING PHYSICIAN: Nicholes Mango, MD  DATE OF DISCHARGE: 08/04/2018  PRIMARY CARE PHYSICIAN: Denton Lank, MD    ADMISSION DIAGNOSIS:  Weakness [R53.1] Community acquired pneumonia, unspecified laterality [J18.9] Sepsis, due to unspecified organism, unspecified whether acute organ dysfunction present ([place]) [A41.9]  DISCHARGE DIAGNOSIS:  Active Problems:   Sepsis ([place])   SECONDARY DIAGNOSIS:   Past Medical History:  Diagnosis Date  . Arthritis   . Blind left eye   . Dementia [facility])     HOSPITAL COURSE:   HPI  [name]  is a 85 y.o. female with a known history of dementia, osteoarthritis and blind in the left eye is brought into the emergency department by her daughter for fever and worsening of her mental status.  Urinalysis is abnormal and chest x-ray with infiltrates versus atelectasis with interstitial edema.  Patient was given fluid bolus and broad-spectrum IV antibiotics are started.  Initially daughter has requested to be transferred to [facility] but eventually she has changed her mind and decided to stay with [facility].  Reviewed CT scan of the renal stone study ordered by the ED physician.  During my examination daughter is at bedside and patient is arousable to verbal commands could recognize her daughter but unable to answer any questions  Hospital course  #Sepsis from UTI and pneumonia Patient met septic criteria at the time of admission with fever and elevated lactic acid IV fluid bolus was given in the ED and lactic acid is trending down Blood cultures are negative urine culture with multiple species contaminated specimen  sputum cultures ordered but patient could not provide any specimen Treated with broad-spectrum IV antibiotics aztreonam and vancomycin during  the hospital course and discharging home with Bactrim as patient is allergic to penicillin and cephalosporins.  Daughter is agreeable Daughter refused CT scan of the abdomen at this point of time as mom does not have any abdominal pain COVIDnegative No leukocytosis and lactic acid trended down  #Hypokalemia repleted    #Metabolic encephalopathy from sepsis with a chronic history of underlying dementia Clinically improving, back to her baseline Daughter was given permission to stay with mom given her clinical situation  #Pneumonia with the parapneumonic effusion Treated with broad-spectrum antibiotics and bronchodilators, clinically improved discharging home with the Bactrim Echocardiogram -60 to 65% left ventricular ejection fraction normal systolic function  #Urinary tract infection with history of bladder prolapse and daughter does in and out cath for her mom as needed Urine culture with multiple bacteria discharging home with a p.o. Bactrim for pneumonia which should cover underlying urinary tract infection if present  Gentle hydration provided  #Chronic history of dementia Continue close monitoring patient is not on any medications at this time  #Legally blind in left eye  #Osteoarthritis continue naproxen home medication with food  #Generalized weakness physical therapy evaluation recommending home health PT 24-hour supervision recommended.  Daughter endorsed that she can provide 24-hour supervision and refused home PT  Discharge patient home to daughter's care   DISCHARGE CONDITIONS:   Stable  CONSULTS OBTAINED:     PROCEDURES none  DRUG ALLERGIES:   Allergies  Allergen Reactions  . Ceftriaxone Other (See Comments) and Itching    Reaction: Unknown  . Penicillins Other (See Comments)    Reaction: Unknown    DISCHARGE  MEDICATIONS:   Allergies as of 08/04/2018      Reactions   Ceftriaxone Other (See Comments), Itching   Reaction: Unknown    Penicillins Other (See Comments)   Reaction: Unknown      Medication List    STOP taking these medications   ciprofloxacin 500 MG tablet Commonly known as:  Cipro     TAKE these medications   acetaminophen 325 MG tablet Commonly known as:  TYLENOL Take 2 tablets (650 mg total) by mouth every 6 (six) hours as needed for mild pain or headache (or Fever >/= 101).   mupirocin ointment 2 % Commonly known as:  BACTROBAN Place 1 application into the nose 2 (two) times daily for 5 days.   naproxen 250 MG tablet Commonly known as:  NAPROSYN Take by mouth 2 (two) times daily with a meal.   sulfamethoxazole-trimethoprim 800-160 MG tablet Commonly known as:  BACTRIM DS Take 1 tablet by mouth every 12 (twelve) hours for 5 days.        DISCHARGE INSTRUCTIONS:   Follow-up with primary care physician on June 5 as scheduled   DIET:  Regular diet  DISCHARGE CONDITION:  Stable  ACTIVITY:  Activity as tolerated  OXYGEN:  Home Oxygen: No.   Oxygen Delivery: room air  DISCHARGE LOCATION:  home   If you experience worsening of your admission symptoms, develop shortness of breath, life threatening emergency, suicidal or homicidal thoughts you must seek medical attention immediately by calling 911 or calling your MD immediately  if symptoms less severe.  You Must read complete instructions/literature along with all the possible adverse reactions/side effects for all the Medicines you take and that have been prescribed to you. Take any new Medicines after you have completely understood and accpet all the possible adverse reactions/side effects.   Please note  You were cared for by a hospitalist during your hospital stay. If you have any questions about your discharge medications or the care you received while you were in the hospital after you are discharged, you can call the unit and asked to speak with the hospitalist on call if the hospitalist that took care of you is not  available. Once you are discharged, your primary care physician will handle any further medical issues. Please note that NO REFILLS for any discharge medications will be authorized once you are discharged, as it is imperative that you return to your primary care physician (or establish a relationship with a primary care physician if you do not have one) for your aftercare needs so that they can reassess your need for medications and monitor your lab values.     Today  Chief Complaint  Patient presents with  . Fever   Patient is doing fine.  Daughter at bedside.  Daughter endorses patient is not coughing much and doing fine wants to go home.  ROS:  Unobtainable as the patient has baseline dementia.   VITAL SIGNS:  Blood pressure (!) 139/46, pulse 63, temperature 98.6 F (37 C), temperature source Oral, resp. rate 19, height 5' 2"  (1.575 m), weight 64.4 kg, SpO2 97 %.  I/O:    Intake/Output Summary (Last 24 hours) at 08/04/2018 1226 Last data filed at 08/04/2018 1200 Gross per 24 hour  Intake 1209.2 ml  Output 1600 ml  Net -390.8 ml    PHYSICAL EXAMINATION:  GENERAL:  85 y.o.-year-old patient lying in the bed with no acute distress.  EYES: Pupils equal, round, reactive to light and accommodation. No scleral icterus.  Extraocular muscles intact.  HEENT: Head atraumatic, normocephalic. Oropharynx and nasopharynx clear.  NECK:  Supple, no jugular venous distention. No thyroid enlargement, no tenderness.  LUNGS: Normal breath sounds bilaterally, no wheezing, rales,rhonchi or crepitation. No use of accessory muscles of respiration.  CARDIOVASCULAR: S1, S2 normal. No murmurs, rubs, or gallops.  ABDOMEN: Soft, non-tender, non-distended. Bowel sounds present.  EXTREMITIES: No pedal edema, cyanosis, or clubbing.  NEUROLOGIC: Awake and alert, disoriented sensation intact. Gait not checked.  PSYCHIATRIC: The patient is alert and disoriented SKIN: No obvious rash, lesion, or ulcer.   DATA  REVIEW:   CBC Recent Labs  Lab 08/03/18 0500  WBC 5.3  HGB 10.8*  HCT 33.6*  PLT 125*    Chemistries  Recent Labs  Lab 08/03/18 0500 08/04/18 0639  NA 144  --   K 3.3*  --   CL 114*  --   CO2 25  --   GLUCOSE 79  --   BUN 20  --   CREATININE 0.87 0.72  CALCIUM 7.9*  --   AST 27  --   ALT 13  --   ALKPHOS 88  --   BILITOT 0.8  --     Cardiac Enzymes Recent Labs  Lab 08/01/18 2315  TROPONINI <0.03    Microbiology Results  Results for orders placed or performed during the hospital encounter of 08/02/18  Culture, blood (Routine x 2)     Status: None (Preliminary result)   Collection Time: 08/01/18 11:15 PM  Result Value Ref Range Status   Specimen Description BLOOD RIGHT HAND  Final   Special Requests   Final    BOTTLES DRAWN AEROBIC AND ANAEROBIC Blood Culture adequate volume   Culture  Setup Time PENDING  Incomplete   Culture   Final    NO GROWTH 3 DAYS Performed at [facility], [address] [place], [place] [address]    Report Status PENDING  Incomplete  Culture, blood (Routine x 2)     Status: None (Preliminary result)   Collection Time: 08/02/18 12:38 AM  Result Value Ref Range Status   Specimen Description BLOOD LEFT ANTECUBITAL  Final   Special Requests   Final    BOTTLES DRAWN AEROBIC AND ANAEROBIC Blood Culture adequate volume   Culture   Final    NO GROWTH 2 DAYS Performed at [facility], [address], [place], [place] [address]    Report Status PENDING  Incomplete  Urine culture     Status: Abnormal   Collection Time: 08/02/18  4:30 AM  Result Value Ref Range Status   Specimen Description   Final    URINE, RANDOM Performed at [facility], [address], [place], [place] [address]    Special Requests   Final    NONE Performed at [facility], [address] [place], [place] [address]    Culture (A)  Final    >=100,000 COLONIES/mL MULTIPLE SPECIES PRESENT, SUGGEST RECOLLECTION   Report  Status 08/03/2018 FINAL  Final  SARS Coronavirus 2 (CEPHEID - Performed in [facility] hospital lab), Hosp Order     Status: None   Collection Time: 08/02/18  6:30 AM  Result Value Ref Range Status   SARS Coronavirus 2 NEGATIVE NEGATIVE Final    Comment: (NOTE) If result is NEGATIVE SARS-CoV-2 target nucleic acids are NOT DETECTED. The SARS-CoV-2 RNA is generally detectable in upper and lower  respiratory specimens during the acute phase of infection. The lowest  concentration of  SARS-CoV-2 viral copies this assay can detect is 250  copies / mL. A negative result does not preclude SARS-CoV-2 infection  and should not be used as the sole basis for treatment or other  patient management decisions.  A negative result may occur with  improper specimen collection / handling, submission of specimen other  than nasopharyngeal swab, presence of viral mutation(s) within the  areas targeted by this assay, and inadequate number of viral copies  (<250 copies / mL). A negative result must be combined with clinical  observations, patient history, and epidemiological information. If result is POSITIVE SARS-CoV-2 target nucleic acids are DETECTED. The SARS-CoV-2 RNA is generally detectable in upper and lower  respiratory specimens dur ing the acute phase of infection.  Positive  results are indicative of active infection with SARS-CoV-2.  Clinical  correlation with patient history and other diagnostic information is  necessary to determine patient infection status.  Positive results do  not rule out bacterial infection or co-infection with other viruses. If result is PRESUMPTIVE POSTIVE SARS-CoV-2 nucleic acids MAY BE PRESENT.   A presumptive positive result was obtained on the submitted specimen  and confirmed on repeat testing.  While 2019 novel coronavirus  (SARS-CoV-2) nucleic acids may be present in the submitted sample  additional confirmatory testing may be necessary for epidemiological  and  / or clinical management purposes  to differentiate between  SARS-CoV-2 and other Sarbecovirus currently known to infect humans.  If clinically indicated additional testing with an alternate test  methodology [phone]) is advised. The SARS-CoV-2 RNA is generally  detectable in upper and lower respiratory sp ecimens during the acute  phase of infection. The expected result is Negative. Fact Sheet for Patients:  StrictlyIdeas.no Fact Sheet for Healthcare Providers: BankingDealers.co.za This test is not yet approved or cleared by the [place] FDA and has been authorized for detection and/or diagnosis of SARS-CoV-2 by FDA under an Emergency Use Authorization (EUA).  This EUA will remain in effect (meaning this test can be used) for the duration of the COVID-19 declaration under Section 564(b)(1) of the Act, 21 U.S.C. section 360bbb-3(b)(1), unless the authorization is terminated or revoked sooner. Performed at [facility], [address] [place], [place] [address]   MRSA PCR Screening     Status: Abnormal   Collection Time: 08/02/18  6:48 PM  Result Value Ref Range Status   MRSA by PCR POSITIVE (A) NEGATIVE Final    Comment:        The GeneXpert MRSA Assay (FDA approved for NASAL specimens only), is one component of a comprehensive MRSA colonization surveillance program. It is not intended to diagnose MRSA infection nor to guide or monitor treatment for MRSA infections. RESULT CALLED TO, READ BACK BY AND VERIFIED WITH: MARSHA HATTA @2004  08/02/18 MJU Performed at [facility], [address] [place], [place] [address]     RADIOLOGY:  Dg Chest Port 1 View  Result Date: 08/02/2018 CLINICAL DATA:  Initial evaluation for acute sepsis. EXAM: PORTABLE CHEST 1 VIEW COMPARISON:  Prior radiograph from 05/23/2013. FINDINGS: Mild cardiomegaly, stable from previous. Mediastinal silhouette within normal limits. Aortic  atherosclerosis. Lungs hypoinflated. Perihilar vascular congestion with mild diffuse interstitial prominence, suggesting mild pulmonary interstitial congestion/edema. Superimposed patchy bibasilar opacities, left greater than right, which could reflect atelectasis and/or infiltrates. Superimposed small left pleural effusion. No pneumothorax. Severe fracture deformities and/or degenerative changes noted about the shoulders bilaterally. Underlying osteopenia. No acute osseous abnormality. IMPRESSION: 1. Cardiomegaly with mild diffuse pulmonary interstitial  congestion/edema. 2. Superimposed patchy bibasilar opacities, left greater than right, which could reflect atelectasis and/or infiltrates. 3. Superimposed small left pleural effusion. 4. Aortic atherosclerosis. Electronically Signed   By: Jeannine Boga M.D.   On: 08/02/2018 03:59    EKG:   Orders placed or performed during the hospital encounter of 08/02/18  . ED EKG 12-Lead  . ED EKG 12-Lead  . EKG 12-Lead  . EKG 12-Lead      Management plans discussed with the patient, daughter at bedside and they are in agreement.  CODE STATUS:     Code Status Orders  (From admission, onward)         Start     Ordered   08/02/18 0853  Do not attempt resuscitation (DNR)  Continuous    Question Answer Comment  In the event of cardiac or respiratory ARREST Do not call a "code blue"   In the event of cardiac or respiratory ARREST Do not perform Intubation, CPR, defibrillation or ACLS   In the event of cardiac or respiratory ARREST Use medication by any route, position, wound care, and other measures to relive pain and suffering. May use oxygen, suction and manual treatment of airway obstruction as needed for comfort.   Comments RN may pronounce      08/02/18 0855        Code Status History    This patient has a current code status but no historical code status.    Advance Directive Documentation     Most Recent Value  Type of Advance  Directive  Healthcare Power of Attorney  Pre-existing out of facility DNR order (yellow form or pink MOST form)  -  "MOST" Form in Place?  -      TOTAL TIME TAKING CARE OF THIS PATIENT: 43  minutes.   Note: This dictation was prepared with Dragon dictation along with smaller phrase technology. Any transcriptional errors that result from this process are unintentional.   @MEC @  on 08/04/2018 at 12:26 PM  Between 7am to 6pm - Pager - [phone]  After 6pm go to [email or web address] - password [email or web address] [facility]  [facility]  Office  [phone]  CC: Primary care physician; Denton Lank, MD

## 2018-08-05 NOTE — Progress Notes (Signed)
Pharmacy Antimicrobial Stewardship - Brief note  Microbiology contacted pharmacy to communicate patient has GNR in 1 set of blood cultures from 08/01/2018.  Micro tech stated thought it may be camplyobacter.  Patient sent home on bactrim PO. Discussed new information with Dr Amado Coe (discharging physician).  Bactrim does not have activity against this organism.  Discussed options of careful observation of patient while continuing to bactrim pending final cx results or change to antibiotic that is more likely to cover potential pathogens, including campylobacter.  Decision made to change to levofloxacin.  A prescription for levofloxacin 250mg  PO daily x 7 days called into [facility] after discussion with Dr Amado Coe.  Juliette Alcide, PharmD, BCPS.   Work Cell: [phone] 08/05/2018 3:02 PM

## 2018-08-07 LAB — CULTURE, BLOOD (ROUTINE X 2)
Culture: NO GROWTH
Special Requests: ADEQUATE

## 2018-08-08 ENCOUNTER — Emergency Department
Admission: EM | Admit: 2018-08-08 | Discharge: 2018-08-09 | Disposition: A | Discharge status: Home / Self Care | Payer: Medicare Other | Attending: Emergency Medicine | Admitting: Emergency Medicine

## 2018-08-08 ENCOUNTER — Encounter: Payer: Self-pay | Admitting: *Deleted

## 2018-08-08 ENCOUNTER — Other Ambulatory Visit: Payer: Self-pay

## 2018-08-08 DIAGNOSIS — R569 Unspecified convulsions: Secondary | ICD-10-CM | POA: Insufficient documentation

## 2018-08-08 NOTE — ED Triage Notes (Signed)
Pt to ED from home via EMS after having a seizure earlier in the day. Family had originally refused transportation to the ED but family called back and are requesting pt be evaluated for seizure activity and revaluated for UTI dx last week. Hx of dementia.

## 2018-08-08 NOTE — ED Provider Notes (Signed)
[facility] Emergency Department Provider Note   ____________________________________________   I have reviewed the triage vital signs and the nursing notes.   HISTORY  Chief Complaint Seizures   History limited by and level 5 caveat due to: Dementia  HPI [name] is a 85 y.o. female who presents to the emergency department today via EMS because of concern for seizure. Per report the patient had had a seizure earlier today however family refused transport when patient was initially evaluated. This evening apparently the family decided they did want the patient evaluated so called 911 back.    Records reviewed. Per medical record review patient has a history of recent admission for sepsis secondary to pneumonia and UTI.   Past Medical History:  Diagnosis Date  . Arthritis   . Blind left eye   . Dementia [facility](HCC)     Patient Active Problem List   Diagnosis Date Noted  . Sepsis (HCC) 08/02/2018    Past Surgical History:  Procedure Laterality Date  . CHOLECYSTECTOMY      Prior to Admission medications   Medication Sig Start Date End Date Taking? Authorizing Provider  acetaminophen (TYLENOL) 325 MG tablet Take 2 tablets (650 mg total) by mouth every 6 (six) hours as needed for mild pain or headache (or Fever >/= 101). 08/04/18   Ramonita LabGouru, Aruna, MD  mupirocin ointment (BACTROBAN) 2 % Place 1 application into the nose 2 (two) times daily for 5 days. 08/04/18 08/09/18  Ramonita LabGouru, Aruna, MD  naproxen (NAPROSYN) 250 MG tablet Take by mouth 2 (two) times daily with a meal.    [provider]  sulfamethoxazole-trimethoprim (BACTRIM DS) 800-160 MG tablet Take 1 tablet by mouth every 12 (twelve) hours for 5 days. 08/04/18 08/09/18  Ramonita LabGouru, Aruna, MD    Allergies Ceftriaxone and Penicillins  History reviewed. No pertinent family history.  Social History Social History   Tobacco Use  . Smoking status: Never Smoker  . Smokeless tobacco: Former NeurosurgeonUser   Types: Snuff  Substance Use Topics  . Alcohol use: No    Frequency: Never  . Drug use: Never    Review of Systems Unable to obtain ROS secondary to dementia.  ____________________________________________   PHYSICAL EXAM:  VITAL SIGNS: ED Triage Vitals  Enc Vitals Group     BP 08/08/18 2305 98/80     Pulse Rate 08/08/18 2305 60     Resp 08/08/18 2305 16     Temp 08/08/18 2305 98.3 F (36.8 C)     Temp Source 08/08/18 2305 Oral     SpO2 08/08/18 2305 95 %     Weight 08/08/18 2306 142 lb (64.4 kg)     Height 08/08/18 2306 5\' 2"  (1.575 m)   Constitutional: Awake and alert. Not oriented.  Eyes: Conjunctivae are normal.  ENT      Head: Normocephalic and atraumatic.      Nose: No congestion/rhinnorhea.      Mouth/Throat: Mucous membranes are moist.      Neck: No stridor. Hematological/Lymphatic/Immunilogical: No cervical lymphadenopathy. Cardiovascular: Normal rate, regular rhythm.  No murmurs, rubs, or gallops.  Respiratory: Normal respiratory effort without tachypnea nor retractions. Breath sounds are clear and equal bilaterally. No wheezes/rales/rhonchi. Gastrointestinal: Soft and non tender. No rebound. No guarding.  Genitourinary: Deferred Musculoskeletal: Normal range of motion in all extremities. No lower extremity edema. Neurologic:  Dementia.  Skin:  Skin is warm, dry and intact. No rash noted. ____________________________________________    LABS (pertinent positives/negatives)  Lactic acid 1.7  BMP wnl except glu 136, cr 0.91, ca 8.2 CBC wbc 6.4, hgb 11.3, plt 168 UA hazy, moderate hgb dipstick, small leukocytes, 11-20 rbc, 21-50 wbc, bacteria none seen  ____________________________________________   EKG  None  ____________________________________________    RADIOLOGY  CT head No acute abnormality  ____________________________________________   PROCEDURES  Procedures  ____________________________________________   INITIAL IMPRESSION /  ASSESSMENT AND PLAN / ED COURSE  Pertinent labs & imaging results that were available during my care of the patient were reviewed by me and considered in my medical decision making (see chart for details).   Patient presented to the emergency department today because of concerns for possible seizure the patient had earlier.  The patient herself cannot give any history.  However her daughter was able to describe what she saw.  She states that the patient stiffened up.  She states this happened while she was sitting down and last maybe 10 minutes.  Afterwards she felt like her mother was weak.  Daughter states that she has had similar episodes in the past when she has had elevated lactic acid levels or urinary tract infections.  Patient's work-up today without any concerning lactic acidosis.  Urine did have some white blood cells however no bacteria.  Patient is currently finishing up treatment for urinary tract infection.  Daughter states that another urine culture was sent earlier yesterday.  At this point do feel is reasonable for patient be discharged home.  Will give patient's family neurology follow-up information given that this is not the first time this is happened.  ____________________________________________   FINAL CLINICAL IMPRESSION(S) / ED DIAGNOSES  Final diagnoses:  Seizure-like activity ([place])     Note: This dictation was prepared with Dragon dictation. Any transcriptional errors that result from this process are unintentional     Nance Pear, MD 08/09/18 [phone]

## 2018-08-09 ENCOUNTER — Emergency Department: Payer: Medicare Other

## 2018-08-09 LAB — BASIC METABOLIC PANEL
Anion gap: 6 (ref 5–15)
BUN: 14 mg/dL (ref 8–23)
CO2: 24 mmol/L (ref 22–32)
Calcium: 8.2 mg/dL — ABNORMAL LOW (ref 8.9–10.3)
Chloride: 108 mmol/L (ref 98–111)
Creatinine, Ser: 0.91 mg/dL (ref 0.44–1.00)
GFR calc Af Amer: >60 mL/min (ref >60)
GFR calc non Af Amer: 54 mL/min — ABNORMAL LOW (ref >60)
Glucose, Bld: 136 mg/dL — ABNORMAL HIGH (ref 70–99)
Potassium: 3.9 mmol/L (ref 3.5–5.1)
Sodium: 138 mmol/L (ref 135–145)

## 2018-08-09 LAB — CBC WITH DIFFERENTIAL/PLATELET
Abs Immature Granulocytes: 0.03 10*3/uL (ref 0.00–0.07)
Basophils Absolute: 0.1 10*3/uL (ref 0.0–0.1)
Basophils Relative: 1 %
Eosinophils Absolute: 0.2 10*3/uL (ref 0.0–0.5)
Eosinophils Relative: 3 %
HCT: 34.1 % — ABNORMAL LOW (ref 36.0–46.0)
Hemoglobin: 11.3 g/dL — ABNORMAL LOW (ref 12.0–15.0)
Immature Granulocytes: 1 %
Lymphocytes Relative: 23 %
Lymphs Abs: 1.5 10*3/uL (ref 0.7–4.0)
MCH: 31.4 pg (ref 26.0–34.0)
MCHC: 33.1 g/dL (ref 30.0–36.0)
MCV: 94.7 fL (ref 80.0–100.0)
Monocytes Absolute: 0.5 10*3/uL (ref 0.1–1.0)
Monocytes Relative: 7 %
Neutro Abs: 4.3 10*3/uL (ref 1.7–7.7)
Neutrophils Relative %: 65 %
Platelets: 168 10*3/uL (ref 150–400)
RBC: 3.6 MIL/uL — ABNORMAL LOW (ref 3.87–5.11)
RDW: 13.7 % (ref 11.5–15.5)
WBC: 6.4 10*3/uL (ref 4.0–10.5)
nRBC: 0 % (ref 0.0–0.2)

## 2018-08-09 LAB — URINALYSIS, COMPLETE (UACMP) WITH MICROSCOPIC
Bacteria, UA: NONE SEEN
Bilirubin Urine: NEGATIVE
Glucose, UA: NEGATIVE mg/dL
Ketones, ur: NEGATIVE mg/dL
Nitrite: NEGATIVE
Protein, ur: NEGATIVE mg/dL
Specific Gravity, Urine: 1.017 (ref 1.005–1.030)
pH: 6 (ref 5.0–8.0)

## 2018-08-09 LAB — LACTIC ACID, PLASMA: Lactic Acid, Venous: 1.7 mmol/L (ref 0.5–1.9)

## 2018-08-09 NOTE — ED Notes (Signed)
Pt resting quietly in bed, daughter at bedside; pt slow to answer questions; daughter says today is the first day she's really been this way; believes her dementia is worsening; pt states she is warm enough and comfortable; side rails up with seizure pads in place; call bell in reach

## 2018-08-09 NOTE — ED Notes (Signed)
Patient's daughter declined CT scan. MD aware.

## 2018-08-09 NOTE — Discharge Instructions (Addendum)
Please seek medical attention for any high fevers, chest pain, shortness of breath, change in behavior, persistent vomiting, bloody stool or any other new or concerning symptoms.  

## 2018-08-09 NOTE — ED Notes (Signed)
Dr Goodman in to follow up 

## 2018-08-18 LAB — CULTURE, BLOOD (ROUTINE X 2): Special Requests: ADEQUATE

## 2018-08-18 LAB — ORGANISM ID, BACTERIA

## 2018-08-18 LAB — BACTERIAL ORGANISM REFLEX

## 2019-08-28 ENCOUNTER — Encounter: Payer: Self-pay | Admitting: Emergency Medicine

## 2019-08-28 ENCOUNTER — Emergency Department
Admission: EM | Admit: 2019-08-28 | Discharge: 2019-08-28 | Disposition: A | Discharge status: Home / Self Care | Payer: Medicare Other | Attending: Emergency Medicine | Admitting: Emergency Medicine

## 2019-08-28 ENCOUNTER — Other Ambulatory Visit: Payer: Self-pay

## 2019-08-28 DIAGNOSIS — N390 Urinary tract infection, site not specified: Secondary | ICD-10-CM | POA: Diagnosis not present

## 2019-08-28 DIAGNOSIS — R410 Disorientation, unspecified: Secondary | ICD-10-CM | POA: Diagnosis present

## 2019-08-28 DIAGNOSIS — F0390 Unspecified dementia without behavioral disturbance: Secondary | ICD-10-CM | POA: Insufficient documentation

## 2019-08-28 LAB — URINALYSIS, COMPLETE (UACMP) WITH MICROSCOPIC
Bilirubin Urine: NEGATIVE
Glucose, UA: NEGATIVE mg/dL
Ketones, ur: NEGATIVE mg/dL
Leukocytes,Ua: NEGATIVE
Nitrite: NEGATIVE
Protein, ur: 100 mg/dL — AB
Specific Gravity, Urine: 1.015 (ref 1.005–1.030)
Squamous Epithelial / LPF: NONE SEEN (ref 0–5)
pH: 7 (ref 5.0–8.0)

## 2019-08-28 LAB — CBC WITH DIFFERENTIAL/PLATELET
Abs Immature Granulocytes: 0.02 10*3/uL (ref 0.00–0.07)
Basophils Absolute: 0.1 10*3/uL (ref 0.0–0.1)
Basophils Relative: 1 %
Eosinophils Absolute: 0.2 10*3/uL (ref 0.0–0.5)
Eosinophils Relative: 3 %
HCT: 38.8 % (ref 36.0–46.0)
Hemoglobin: 13.1 g/dL (ref 12.0–15.0)
Immature Granulocytes: 0 %
Lymphocytes Relative: 40 %
Lymphs Abs: 2.5 10*3/uL (ref 0.7–4.0)
MCH: 32.3 pg (ref 26.0–34.0)
MCHC: 33.8 g/dL (ref 30.0–36.0)
MCV: 95.6 fL (ref 80.0–100.0)
Monocytes Absolute: 0.4 10*3/uL (ref 0.1–1.0)
Monocytes Relative: 7 %
Neutro Abs: 3 10*3/uL (ref 1.7–7.7)
Neutrophils Relative %: 49 %
Platelets: 148 10*3/uL — ABNORMAL LOW (ref 150–400)
RBC: 4.06 MIL/uL (ref 3.87–5.11)
RDW: 13.6 % (ref 11.5–15.5)
WBC: 6.2 10*3/uL (ref 4.0–10.5)
nRBC: 0 % (ref 0.0–0.2)

## 2019-08-28 LAB — COMPREHENSIVE METABOLIC PANEL
ALT: 11 U/L (ref 0–44)
AST: 18 U/L (ref 15–41)
Albumin: 3.3 g/dL — ABNORMAL LOW (ref 3.5–5.0)
Alkaline Phosphatase: 150 U/L — ABNORMAL HIGH (ref 38–126)
Anion gap: 6 (ref 5–15)
BUN: 22 mg/dL (ref 8–23)
CO2: 26 mmol/L (ref 22–32)
Calcium: 8.7 mg/dL — ABNORMAL LOW (ref 8.9–10.3)
Chloride: 110 mmol/L (ref 98–111)
Creatinine, Ser: 0.99 mg/dL (ref 0.44–1.00)
GFR calc Af Amer: 57 mL/min — ABNORMAL LOW (ref >60)
GFR calc non Af Amer: 49 mL/min — ABNORMAL LOW (ref >60)
Glucose, Bld: 105 mg/dL — ABNORMAL HIGH (ref 70–99)
Potassium: 4 mmol/L (ref 3.5–5.1)
Sodium: 142 mmol/L (ref 135–145)
Total Bilirubin: 0.8 mg/dL (ref 0.3–1.2)
Total Protein: 5.9 g/dL — ABNORMAL LOW (ref 6.5–8.1)

## 2019-08-28 MED ORDER — FOSFOMYCIN TROMETHAMINE 3 G PO PACK
3.0000 g | PACK | Freq: Once | ORAL | Status: AC
  Administered 2019-08-28: 3 g via ORAL
  Filled 2019-08-28: qty 3

## 2019-08-28 MED ORDER — FOSFOMYCIN TROMETHAMINE 3 G PO PACK: 3.0000 g | PACK | Freq: Once | ORAL | 0 refills | Status: AC

## 2019-08-28 NOTE — ED Provider Notes (Signed)
[facility] Emergency Department Provider Note  Time seen: 3:02 PM  I have reviewed the triage vital signs and the nursing notes.   HISTORY  Chief Complaint Recurrent UTI   HPI [name] is a 86 y.o. female with a past medical history of arthritis, dementia, presents to the emergency department with concern for possible urinary tract infection. According to the daughter who cares for the patient she has to cath the patient twice daily due to bladder prolapse. They state over the past 24 hours patient has been mildly more confused and then this morning when she did the catheterization states the patient had a very strong urine smell. Daughter states they've been through this multiple times in the past with urinary tract infections. They try to go to urgent care which is where she typically takes her urine samples but they were not open today so she came to the emergency department. Here patient is initially sleeping, but awakens to voice, is calm and pleasant. Cannot contribute to history or review of systems.   Past Medical History:  Diagnosis Date  . Arthritis   . Blind left eye   . Dementia [facility])     Patient Active Problem List   Diagnosis Date Noted  . Sepsis (HCC) 08/02/2018    Past Surgical History:  Procedure Laterality Date  . CHOLECYSTECTOMY      Prior to Admission medications   Medication Sig Start Date End Date Taking? Authorizing Provider  acetaminophen (TYLENOL) 325 MG tablet Take 2 tablets (650 mg total) by mouth every 6 (six) hours as needed for mild pain or headache (or Fever >/= 101). 08/04/18   Gouru, Deanna Artis, MD  naproxen (NAPROSYN) 250 MG tablet Take by mouth 2 (two) times daily with a meal.    [provider]    Allergies  Allergen Reactions  . Ceftriaxone Other (See Comments) and Itching    Reaction: Unknown  . Penicillins Other (See Comments)    Reaction: Unknown    History reviewed. No pertinent family  history.  Social History Social History   Tobacco Use  . Smoking status: Never Smoker  . Smokeless tobacco: Former Neurosurgeon    Types: Snuff  Substance Use Topics  . Alcohol use: No  . Drug use: Never    Review of Systems Unable to obtain adequate/accurate review of systems secondary to baseline dementia.  ____________________________________________   PHYSICAL EXAM:  VITAL SIGNS: ED Triage Vitals  Enc Vitals Group     BP --      Pulse Rate 08/28/19 1354 (!) 56     Resp 08/28/19 1354 16     Temp 08/28/19 1354 (!) 96 F (35.6 C)     Temp Source 08/28/19 1354 Axillary     SpO2 08/28/19 1354 97 %     Weight 08/28/19 1355 135 lb (61.2 kg)     Height 08/28/19 1355 5\' 3"  (1.6 m)     Head Circumference --      Peak Flow --      Pain Score --      Pain Loc --      Pain Edu? --      Excl. in GC? --     Constitutional: Patient initially sleeping but awakens to voice. Alert, no acute distress. Eyes: Normal exam ENT      Head: Normocephalic and atraumatic.      Mouth/Throat: Mucous membranes are moist. Cardiovascular: Normal rate, regular rhythm.  Respiratory: Normal respiratory effort without tachypnea  nor retractions. Breath sounds are clear  Gastrointestinal: Soft and nontender. No distention.  Musculoskeletal: Nontender with normal range of motion in all extremities. Neurologic:  No gross focal neurologic deficits  Skin:  Skin is warm, dry and intact.  Psychiatric: Mood and affect are normal.     INITIAL IMPRESSION / ASSESSMENT AND PLAN / ED COURSE  Pertinent labs & imaging results that were available during my care of the patient were reviewed by me and considered in my medical decision making (see chart for details).   Patient presents to the emergency department with slight increasing confusion as well as a strong urine smell. Daughter is concerned for possible urinary tract infection. We'll check labs, urine sample and continue to closely monitor. Differential  would include UTI, metabolic or electrolyte abnormality, dehydration, dementia.  [name] was evaluated in Emergency Department on 08/28/2019 for the symptoms described in the history of present illness. She was evaluated in the context of the global COVID-19 pandemic, which necessitated consideration that the patient might be at risk for infection with the SARS-CoV-2 virus that causes COVID-19. Institutional protocols and algorithms that pertain to the evaluation of patients at risk for COVID-19 are in a state of rapid change based on information released by regulatory bodies including the CDC and federal and state organizations. These policies and algorithms were followed during the patient's care in the ED.  ____________________________________________   FINAL CLINICAL IMPRESSION(S) / ED DIAGNOSES  Urinary tract infection Dementia   Harvest Dark, MD 08/28/19 1504

## 2019-08-28 NOTE — ED Triage Notes (Signed)
Pt arrival via [facility] from home due to chronic UTI's. Patient family in&out cathed last night and got urine that was of a strong odor and cloudy. Patient has hx of dementia and is altered at baseline.   Patient VS stable with EMS, however family refused blood pressures at this time due to bruising that occurs.

## 2019-08-28 NOTE — ED Notes (Signed)
Family member did not want vitals done.

## 2019-08-29 LAB — URINE CULTURE: Culture: >=100000 — AB

## 2020-01-30 ENCOUNTER — Other Ambulatory Visit: Payer: Self-pay

## 2020-01-30 ENCOUNTER — Emergency Department: Payer: Medicare Other

## 2020-01-30 ENCOUNTER — Emergency Department
Admission: EM | Admit: 2020-01-30 | Discharge: 2020-01-31 | Disposition: A | Discharge status: Home / Self Care | Payer: Medicare Other | Attending: Emergency Medicine | Admitting: Emergency Medicine

## 2020-01-30 DIAGNOSIS — Z20822 Contact with and (suspected) exposure to covid-19: Secondary | ICD-10-CM | POA: Diagnosis not present

## 2020-01-30 DIAGNOSIS — N3000 Acute cystitis without hematuria: Secondary | ICD-10-CM | POA: Diagnosis not present

## 2020-01-30 DIAGNOSIS — E872 Acidosis, unspecified: Secondary | ICD-10-CM

## 2020-01-30 DIAGNOSIS — F0390 Unspecified dementia without behavioral disturbance: Secondary | ICD-10-CM | POA: Insufficient documentation

## 2020-01-30 DIAGNOSIS — R569 Unspecified convulsions: Secondary | ICD-10-CM | POA: Diagnosis present

## 2020-01-30 LAB — LACTIC ACID, PLASMA: Lactic Acid, Venous: 3 mmol/L (ref 0.5–1.9)

## 2020-01-30 LAB — URINALYSIS, COMPLETE (UACMP) WITH MICROSCOPIC
Bilirubin Urine: NEGATIVE
Glucose, UA: NEGATIVE mg/dL
Ketones, ur: NEGATIVE mg/dL
Nitrite: NEGATIVE
Protein, ur: NEGATIVE mg/dL
Specific Gravity, Urine: 1.006 (ref 1.005–1.030)
WBC, UA: >50 WBC/hpf — ABNORMAL HIGH (ref 0–5)
pH: 5 (ref 5.0–8.0)

## 2020-01-30 LAB — CBC WITH DIFFERENTIAL/PLATELET
Abs Immature Granulocytes: 0.03 10*3/uL (ref 0.00–0.07)
Basophils Absolute: 0 10*3/uL (ref 0.0–0.1)
Basophils Relative: 1 %
Eosinophils Absolute: 0.1 10*3/uL (ref 0.0–0.5)
Eosinophils Relative: 2 %
HCT: 37.9 % (ref 36.0–46.0)
Hemoglobin: 12.6 g/dL (ref 12.0–15.0)
Immature Granulocytes: 1 %
Lymphocytes Relative: 46 %
Lymphs Abs: 3 10*3/uL (ref 0.7–4.0)
MCH: 31.9 pg (ref 26.0–34.0)
MCHC: 33.2 g/dL (ref 30.0–36.0)
MCV: 95.9 fL (ref 80.0–100.0)
Monocytes Absolute: 0.3 10*3/uL (ref 0.1–1.0)
Monocytes Relative: 5 %
Neutro Abs: 2.8 10*3/uL (ref 1.7–7.7)
Neutrophils Relative %: 45 %
Platelets: 119 10*3/uL — ABNORMAL LOW (ref 150–400)
RBC: 3.95 MIL/uL (ref 3.87–5.11)
RDW: 13.7 % (ref 11.5–15.5)
Smear Review: DECREASED
WBC: 6.3 10*3/uL (ref 4.0–10.5)
nRBC: 0 % (ref 0.0–0.2)

## 2020-01-30 LAB — COMPREHENSIVE METABOLIC PANEL
ALT: 12 U/L (ref 0–44)
AST: 30 U/L (ref 15–41)
Albumin: 3.2 g/dL — ABNORMAL LOW (ref 3.5–5.0)
Alkaline Phosphatase: 134 U/L — ABNORMAL HIGH (ref 38–126)
Anion gap: 7 (ref 5–15)
BUN: 22 mg/dL (ref 8–23)
CO2: 26 mmol/L (ref 22–32)
Calcium: 9.1 mg/dL (ref 8.9–10.3)
Chloride: 109 mmol/L (ref 98–111)
Creatinine, Ser: 1.04 mg/dL — ABNORMAL HIGH (ref 0.44–1.00)
GFR, Estimated: 49 mL/min — ABNORMAL LOW (ref >60)
Glucose, Bld: 115 mg/dL — ABNORMAL HIGH (ref 70–99)
Potassium: 3.7 mmol/L (ref 3.5–5.1)
Sodium: 142 mmol/L (ref 135–145)
Total Bilirubin: 0.8 mg/dL (ref 0.3–1.2)
Total Protein: 5.9 g/dL — ABNORMAL LOW (ref 6.5–8.1)

## 2020-01-30 LAB — MAGNESIUM: Magnesium: 2.2 mg/dL (ref 1.7–2.4)

## 2020-01-30 LAB — RESP PANEL BY RT-PCR (FLU A&B, COVID) ARPGX2
Influenza A by PCR: NEGATIVE
Influenza B by PCR: NEGATIVE
SARS Coronavirus 2 by RT PCR: NEGATIVE

## 2020-01-30 MED ORDER — FOSFOMYCIN TROMETHAMINE 3 G PO PACK
3.0000 g | PACK | Freq: Once | ORAL | Status: AC
  Administered 2020-01-30: 3 g via ORAL
  Filled 2020-01-30: qty 3

## 2020-01-30 MED ORDER — LACTATED RINGERS IV BOLUS
1000.0000 mL | Freq: Once | INTRAVENOUS | Status: AC
  Administered 2020-01-30: 1000 mL via INTRAVENOUS

## 2020-01-30 NOTE — ED Triage Notes (Signed)
Pt arrives from home via [facility] with complaint of seizures. Per EMS, pt has had 2 seizures today, first time in "months." Last seizure at 1200. Family reports seizures began in 2015 but have been rare. Family reports seizures have typically been kicked off by UTI's in the past.   Per family, pt screams when she is about to have  seizure, or when BP is taken.   Pt hx of bilateral shoulder fractures (from 2015) that still cause her problems.   Pt unresponsive and drooling when EMS picked her up. Post seizure approx 30 min before returning to baseline dementia   pulse 82 spo2 95 cbg 101 97.6 t  roceph/pcn allergy  pt on no medications

## 2020-01-30 NOTE — ED Provider Notes (Signed)
[facility] Emergency Department Provider Note ____________________________________________   First MD Initiated Contact with Patient 01/30/20 2039     (approximate)  I have reviewed the triage vital signs and the nursing notes.  HISTORY  Chief Complaint Seizures   HPI [name] is a 86 y.o. femalewho presents to the ED for evaluation of seizure at home.  Chart review indicates history of dementia, arthritis and recurrent UTIs in the past.  Seizure history and her seizures have been related to UTIs in the past.    Patient presents to the ED with her daughter for evaluation of 2 seizures today while at home.  Patient is unable to provide any history due to her baseline altered mental status and dementia.  Daughter provides majority of history at the bedside.  She indicates patient has been "a little bit slower" for the past few days, and then developed two seizures today.  Daughter reports the seizures were typical for the patient, consisting of full body stiffening that self resolve after a few minutes and subsequent postictal episode that last about 30 minutes before patient returns to her baseline.  Daughter reports one episode at about noon, and a second episode at about 7 PM.  Each were identical.  Due to 2 episodes in 1 day, daughter brings her to the ED for evaluation.  Daughter denies any fevers at home, falls or additional concerns. Patient is nonverbal and nonambulatory at baseline.  Past Medical History:  Diagnosis Date  . Arthritis   . Blind left eye   . Dementia [facility])     Patient Active Problem List   Diagnosis Date Noted  . Sepsis (HCC) 08/02/2018    Past Surgical History:  Procedure Laterality Date  . CHOLECYSTECTOMY      Prior to Admission medications   Medication Sig Start Date End Date Taking? Authorizing Provider  acetaminophen (TYLENOL) 325 MG tablet Take 2 tablets (650 mg total) by mouth every 6 (six) hours as needed for  mild pain or headache (or Fever >/= 101). 08/04/18   Ramonita Lab, MD  naproxen (NAPROSYN) 250 MG tablet Take by mouth 2 (two) times daily with a meal.    [provider]    Allergies Ceftriaxone and Penicillins  History reviewed. No pertinent family history.  Social History Social History   Tobacco Use  . Smoking status: Never Smoker  . Smokeless tobacco: Former Neurosurgeon    Types: Snuff  Substance Use Topics  . Alcohol use: No  . Drug use: Never    Review of Systems  Unable to be accurately assessed due to patient's baseline dementia and altered mental status. ____________________________________________   PHYSICAL EXAM:  VITAL SIGNS: Vitals:   01/30/20 2215 01/30/20 2220  BP:    Pulse:  75  Resp: (!) 22 20  Temp:    SpO2: 99% 99%     Constitutional: Supine in bed, chronically ill-appearing.  No evidence of acute distress.  She visually tracks me but does not answer any questions. Eyes: Conjunctivae are normal. PERRL. EOMI. Head: Atraumatic. Nose: No congestion/rhinnorhea. Mouth/Throat: Mucous membranes are dry.  Oropharynx non-erythematous. Neck: No stridor. No cervical spine tenderness to palpation. Cardiovascular: Normal rate, regular rhythm. Grossly normal heart sounds.  Good peripheral circulation. Respiratory: Normal respiratory effort.  No retractions. Lungs CTAB. Gastrointestinal: Soft , nondistended. No CVA tenderness. Mild suprapubic tenderness to palpation without peritoneal features.  Abdomen is otherwise benign.  No CVA tenderness bilaterally. Musculoskeletal:   No joint effusions.  No  deformity to all 4 extremities. Neurologic:   No gross focal neurologic deficits are appreciated.  Skin:  Skin is warm, dry and intact.  Various bruises throughout the bilateral arms. Psychiatric: Mood and affect are normal. Speech and behavior are normal.  ____________________________________________   LABS (all labs ordered are listed, but only abnormal  results are displayed)  Labs Reviewed  COMPREHENSIVE METABOLIC PANEL - Abnormal; Notable for the following components:      Result Value   Glucose, Bld 115 (*)    Creatinine, Ser 1.04 (*)    Total Protein 5.9 (*)    Albumin 3.2 (*)    Alkaline Phosphatase 134 (*)    GFR, Estimated 49 (*)    All other components within normal limits  CBC WITH DIFFERENTIAL/PLATELET - Abnormal; Notable for the following components:   Platelets 119 (*)    All other components within normal limits  LACTIC ACID, PLASMA - Abnormal; Notable for the following components:   Lactic Acid, Venous 3.0 (*)    All other components within normal limits  URINALYSIS, COMPLETE (UACMP) WITH MICROSCOPIC - Abnormal; Notable for the following components:   Color, Urine YELLOW (*)    APPearance CLOUDY (*)    Hgb urine dipstick LARGE (*)    Leukocytes,Ua LARGE (*)    WBC, UA >50 (*)    Bacteria, UA MANY (*)    All other components within normal limits  RESP PANEL BY RT-PCR (FLU A&B, COVID) ARPGX2  URINE CULTURE  MAGNESIUM  LACTIC ACID, PLASMA   ____________________________________________  RADIOLOGY  ED MD interpretation: CT head reviewed by me without evidence of acute intracranial pathology.  Official radiology report(s): CT Head Wo Contrast  Result Date: 01/30/2020 CLINICAL DATA:  Seizures. EXAM: CT HEAD WITHOUT CONTRAST TECHNIQUE: Contiguous axial images were obtained from the base of the skull through the vertex without intravenous contrast. COMPARISON:  August 09, 2018 FINDINGS: Brain: There is mild cerebral atrophy with widening of the extra-axial spaces and ventricular dilatation. There are areas of decreased attenuation within the white matter tracts of the supratentorial brain, consistent with microvascular disease changes. Vascular: No hyperdense vessel or unexpected calcification. Skull: Normal. Negative for fracture or focal lesion. Sinuses/Orbits: No acute finding. Other: None. IMPRESSION: 1. Generalized  cerebral atrophy. 2. No acute intracranial abnormality. Electronically Signed   By: Aram Candela M.D.   On: 01/30/2020 21:29    ____________________________________________   PROCEDURES and INTERVENTIONS  Procedure(s) performed (including Critical Care):  .1-3 Lead EKG Interpretation Performed by: Delton Prairie, MD Authorized by: Delton Prairie, MD     Interpretation: normal     ECG rate:  70   ECG rate assessment: normal     Rhythm: sinus rhythm     Ectopy: none     Conduction: normal      Medications  lactated ringers bolus 1,000 mL (1,000 mLs Intravenous New Bag/Given 01/30/20 2154)  fosfomycin (MONUROL) packet 3 g (3 g Oral Given 01/30/20 2154)    ____________________________________________   MDM / ED COURSE   Bedbound 86 year old woman presents to the ED after a seizure at home, likely due to acute cystitis.  Normal vitals on room air.  Exam with a cachectic patient at her behavioral baseline, per the daughter at her bedside.  No evidence of deformities or acute trauma, though she does have various bruises to her bilateral forearms without evidence of bony injury.  She is no evidence of focal neurologic deficits.  Blood work demonstrates a mild lactic acidosis of 3.0,  likely due to her seizures today and less likely to represent pyelonephritis considering her lack of fevers, flank pain or leukocytosis.  Urine does show infectious features concerning for acute cystitis, and this was sent for a culture.  Considering patient's bedbound status and daughter's work schedule, I am concerned that she would not be able to tolerate 4 times daily dosing of Keflex, and I therefore provide patient a single dose of fosfomycin to treat her UTI.  Patient signed out to oncoming provider to follow-up on repeat lactic acid after fluid resuscitation, with expectation that patient will likely be discharged back home as long as her lactic acid clears.      ____________________________________________   FINAL CLINICAL IMPRESSION(S) / ED DIAGNOSES  Final diagnoses:  Seizure (HCC)  Lactic acidosis  Acute cystitis without hematuria     ED Discharge Orders    None       Cecilie Heidel   Note:  This document was prepared using Dragon voice recognition software and may include unintentional dictation errors.   Delton Prairie, MD 01/30/20 [phone]

## 2020-01-30 NOTE — ED Notes (Signed)
MD notified of critical results (lactic/UA), orders received.   Pt daughter reports difficulties with [facility] personnel. Daughter expressed a desire to file a report against a particular [facility] member, Consulting civil engineer notified, who states she will follow up with daughter for specifics and for complaint filing.   Daughter reports pt baseline changes by the day. Sometimes she is able to stand and use a walker with assistance, wash her hands, hold a simple conversation, and other days she is bed/chair bound and non-communicative.  Daughter reports that for the past approx 2 days, pt has been weaker than normal, and somewhat less communicative. Per daughter, pt initial seizure today was at 1200, with a second seizure at 1930, each lasting approx 3 minutes. She reports contracted seizures, with pt becoming very rigid. "Her seizures are always like that."   Daughter states that pt was in a recliner for both seizures, so did not fall, hit head, or sustain any other obvious injuries (aside from minor skin tear above right ankle). Daughter states that typical postictal phase is roughly 30 min. She states that after the second seizure today, pt was not responsive to verbal stimulation. Denies pt was drooling

## 2020-01-30 NOTE — ED Notes (Signed)
Pt daughter refuses BP readings due to pt discomfort

## 2020-01-30 NOTE — ED Notes (Signed)
Pt had seizure at 1200, then an additional seizure at approx 1930.

## 2020-01-30 NOTE — ED Notes (Signed)
Per EDP, repeat lactic to be drawn after fluid bolus completion

## 2020-01-30 NOTE — ED Notes (Signed)
Per pt daughter, pt did not hit head or sustain injury aside from minor skin tear above right ankle. Bandaged and wrapped.

## 2020-01-30 NOTE — ED Notes (Signed)
Pt able to tolerate PO liquids. Pt responds "yes" to a couple questions from her daughter. Pt makes eye contact with this RN.

## 2020-01-31 LAB — LACTIC ACID, PLASMA: Lactic Acid, Venous: 2.2 mmol/L (ref 0.5–1.9)

## 2020-01-31 NOTE — ED Notes (Signed)
Pt POA refused blood pressure at this time for final set of vital signs.

## 2020-01-31 NOTE — ED Notes (Signed)
Pt waiting for transport at this time

## 2020-01-31 NOTE — Discharge Instructions (Addendum)
Return to the ER for worsening symptoms, persistent vomiting, fever, difficulty breathing or other concerns. 

## 2020-01-31 NOTE — ED Provider Notes (Signed)
-----------------------------------------   12:31 AM on 01/31/2020 -----------------------------------------  Lactic acid down to 2.2.  Patient will be discharged per previous providers plan.  Return precautions given.  Daughter verbalizes understanding agrees with plan of care.   ----------------------------------------- 1:03 AM on 01/31/2020 -----------------------------------------  Patient required heavy assistance to get her into the wheelchair for discharge.  I did offer hospitalization and/or social work consult.  Daughter states this is happened multiple times before and she is comfortable taking the patient home.  Requesting EMS transport and states she will care for her mother at home.  Very strict return precautions given.  Daughter verbalizes understanding.   Irean Hong, MD 01/31/20 [phone]

## 2020-01-31 NOTE — ED Notes (Signed)
Topaz signature system not working at this time, patient's POA gives verbal consent for dc and denies questions.

## 2020-02-01 LAB — URINE CULTURE: Culture: >=100000 — AB

## 2020-07-02 DEATH — deceased

## 2021-01-11 IMAGING — CT CT HEAD W/O CM
4 series · 17 of 47 positions shown, 19 images · non-contrast
Comparison: August 09, 2018

CLINICAL DATA: Seizures.

EXAM:
CT HEAD WITHOUT CONTRAST
TECHNIQUE: Contiguous axial images were obtained from the base of the skull
through the vertex without intravenous contrast.

[Series 2: head wo · axial · 0.42mm/px · z∈[-132,-17]mm · 7 of 31 slices shown, 9 images]
[im 4/31  brain]
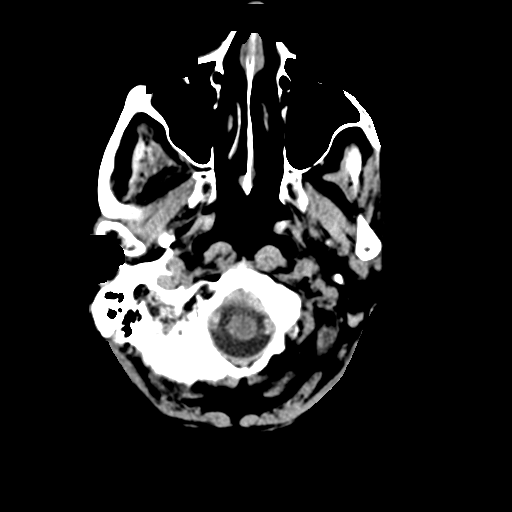
[im 4/31  bone]
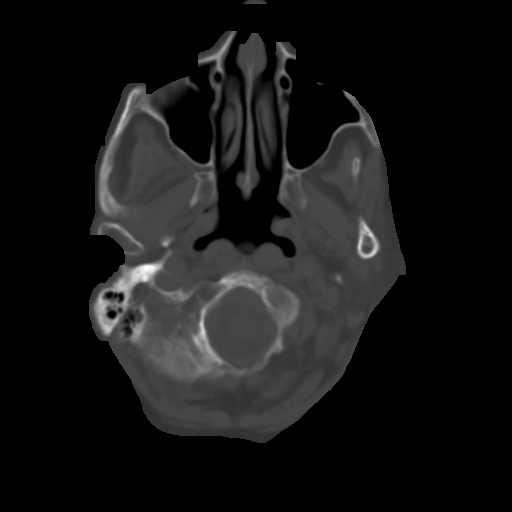
[im 8/31  brain]
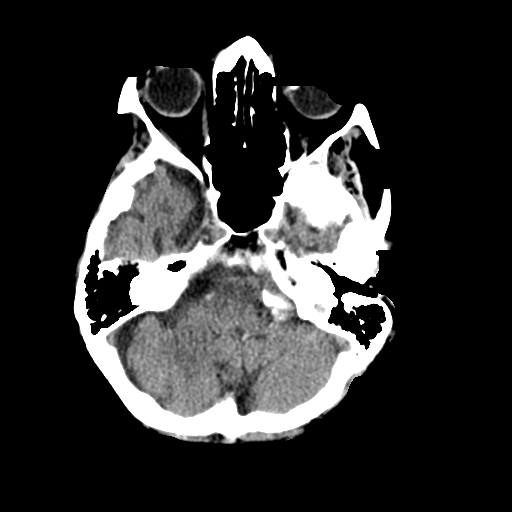
[im 12/31  brain]
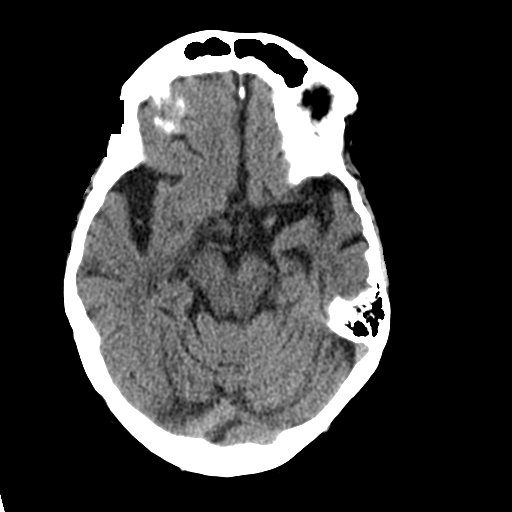
[im 16/31  brain]
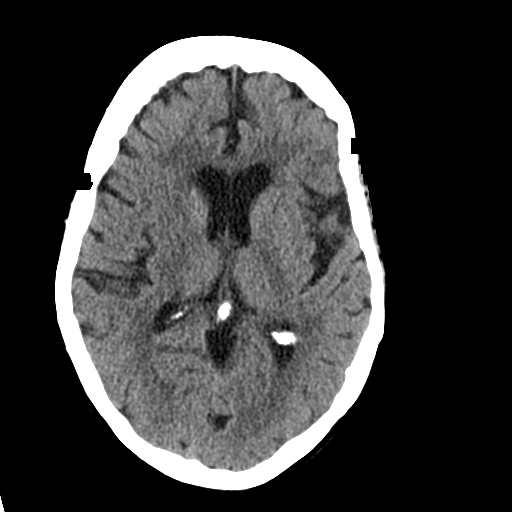
[im 19/31  brain]
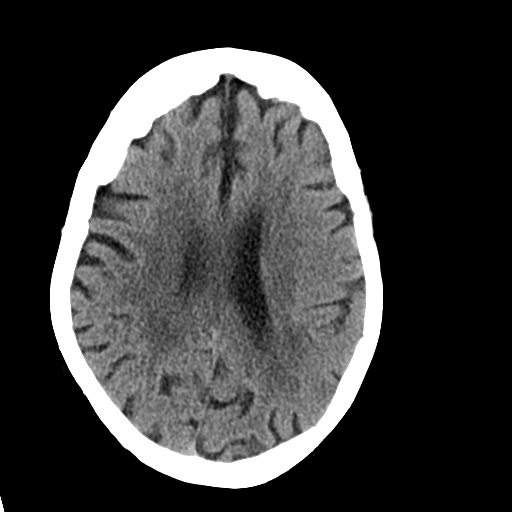
[im 19/31  bone]
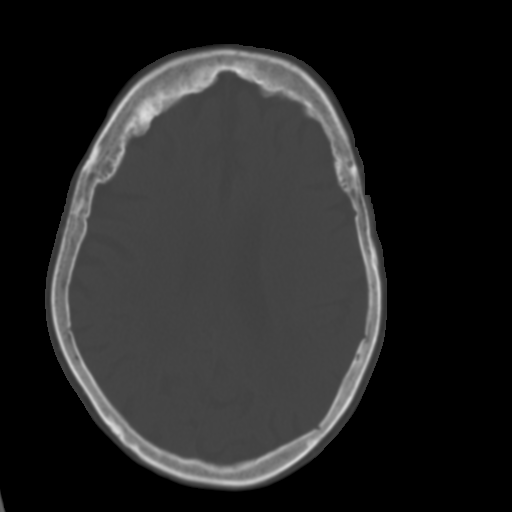
[im 23/31  brain]
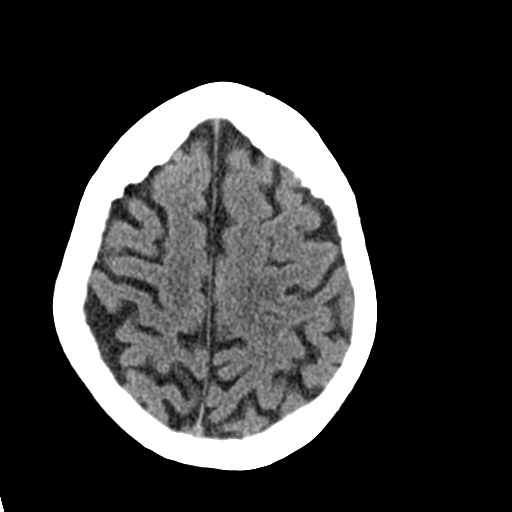
[im 27/31  brain]
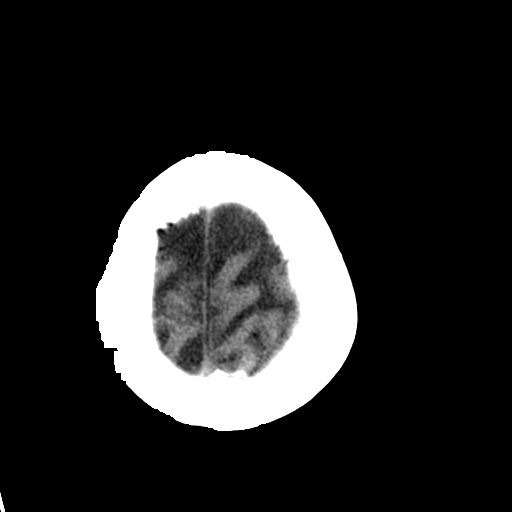

[Series 3: head bone · axial · 0.42mm/px · z∈[-133,-79]mm · 4 of 78 slices shown]
[im 8/78  bone]
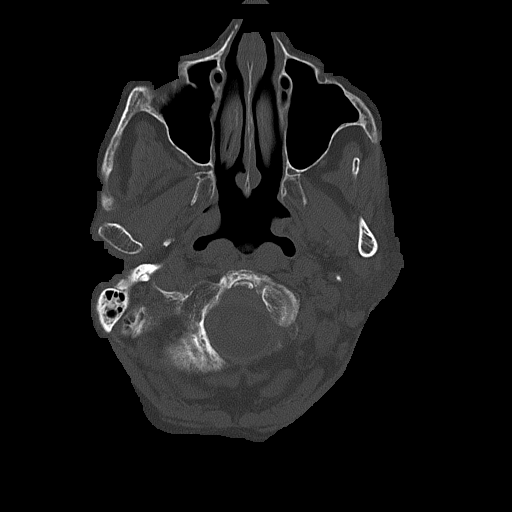
[im 16/78  bone]
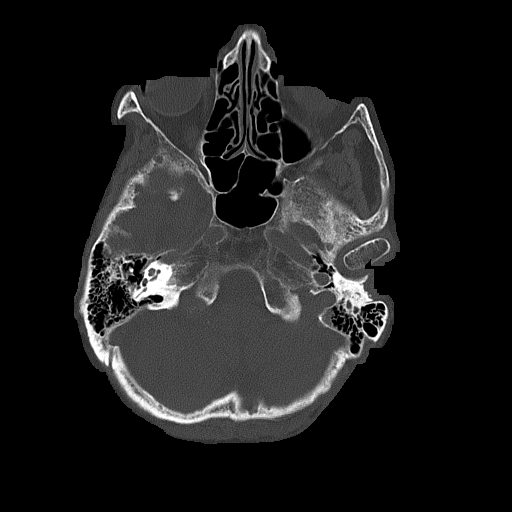
[im 24/78  bone]
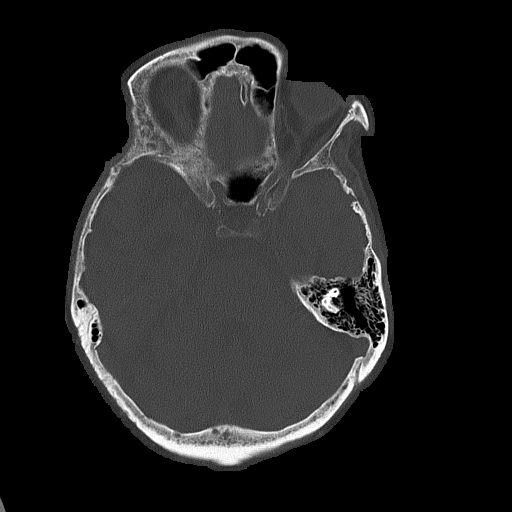
[im 35/78  bone]
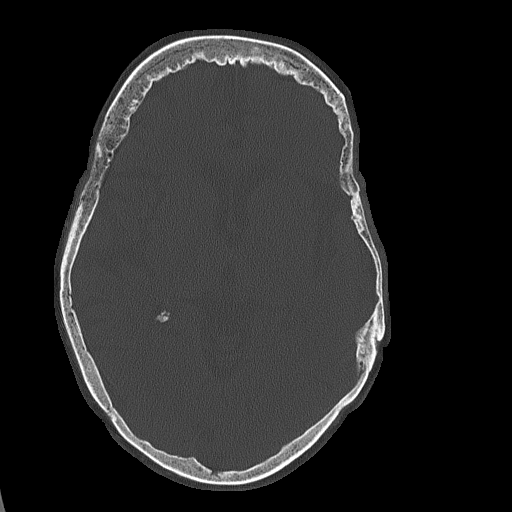

[Series 4: coronal soft tissue · coronal · 0.32mm/px · 3 of 71 slices shown]
[im 24/71  brain]
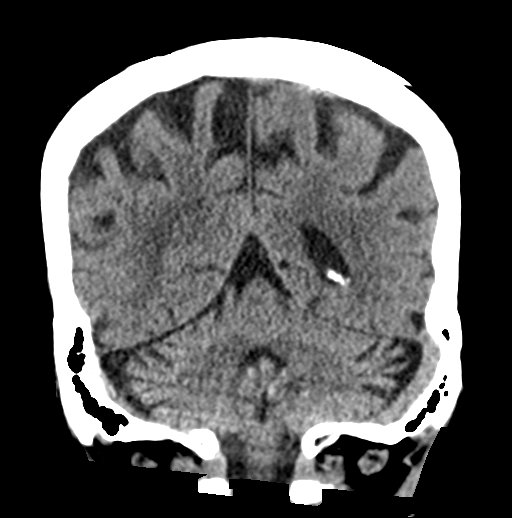
[im 32/71  brain]
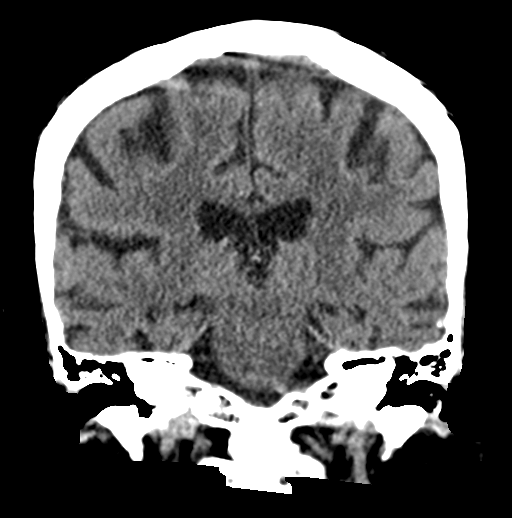
[im 39/71  brain]
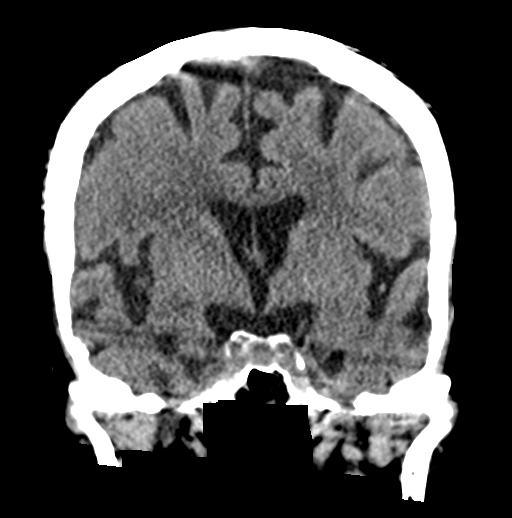

[Series 5: sagittal soft tissue · sagittal · 0.33mm/px · 3 of 54 slices shown]
[im 18/54  brain]
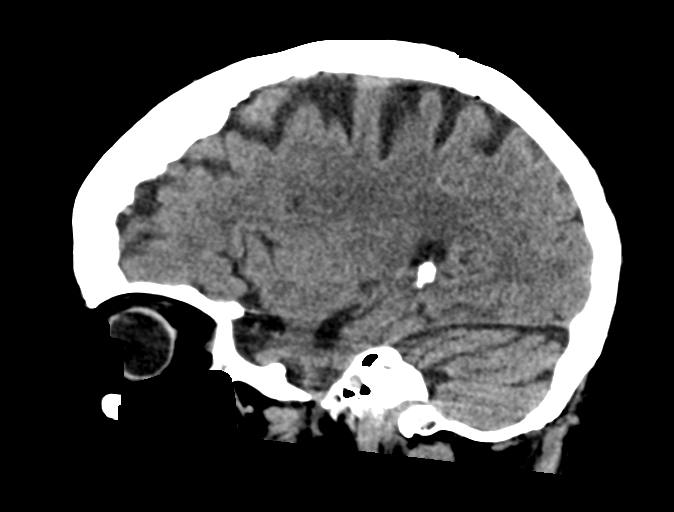
[im 27/54  brain]
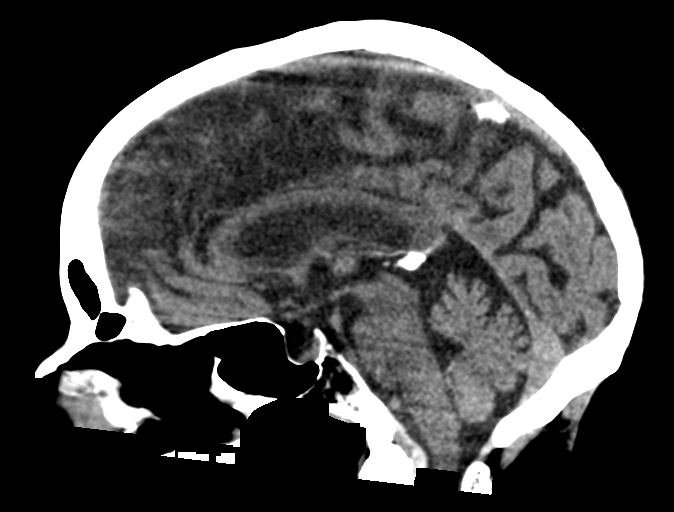
[im 36/54  brain]
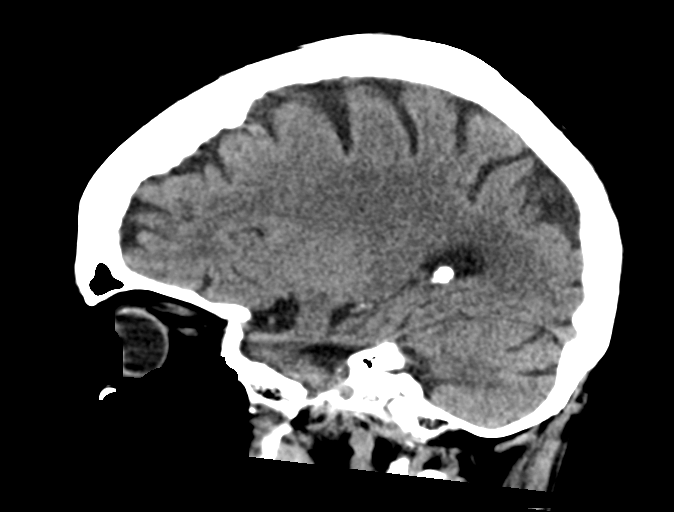

[17 of 47 positions shown; findings below may reference images not displayed]

FINDINGS: Brain: There is mild cerebral atrophy with widening of the
extra-axial spaces and ventricular dilatation.
There are areas of decreased attenuation within the white matter
tracts of the supratentorial brain, consistent with microvascular
disease changes.

Vascular: No hyperdense vessel or unexpected calcification.

Skull: Normal. Negative for fracture or focal lesion.

Sinuses/Orbits: No acute finding.

Other: None.
IMPRESSION: 1. Generalized cerebral atrophy.
2. No acute intracranial abnormality.
